# Patient Record
Sex: Female | Born: 2000 | Race: Black or African American | Hispanic: No | Marital: Single | Smoking: Never smoker
Health system: Southern US, Community
[De-identification: ages and names within clinical notes are randomized; demographics above are authoritative.]

## PROBLEM LIST (undated history)

## (undated) ENCOUNTER — Inpatient Hospital Stay (HOSPITAL_COMMUNITY): Payer: Self-pay

## (undated) DIAGNOSIS — T783XXA Angioneurotic edema, initial encounter: Secondary | ICD-10-CM

## (undated) DIAGNOSIS — O24419 Gestational diabetes mellitus in pregnancy, unspecified control: Secondary | ICD-10-CM

## (undated) DIAGNOSIS — L309 Dermatitis, unspecified: Secondary | ICD-10-CM

## (undated) DIAGNOSIS — R220 Localized swelling, mass and lump, head: Secondary | ICD-10-CM

## (undated) DIAGNOSIS — J45909 Unspecified asthma, uncomplicated: Secondary | ICD-10-CM

## (undated) DIAGNOSIS — F32A Depression, unspecified: Secondary | ICD-10-CM

## (undated) HISTORY — PX: WISDOM TOOTH EXTRACTION: SHX21

## (undated) HISTORY — DX: Angioneurotic edema, initial encounter: T78.3XXA

---
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
## 2020-01-05 ENCOUNTER — Encounter (HOSPITAL_COMMUNITY): Payer: Self-pay | Admitting: Emergency Medicine

## 2020-01-05 ENCOUNTER — Other Ambulatory Visit: Payer: Self-pay

## 2020-01-05 ENCOUNTER — Emergency Department (HOSPITAL_COMMUNITY)
Admission: EM | Admit: 2020-01-05 | Discharge: 2020-01-06 | Disposition: A | Discharge status: Home / Self Care | Payer: Medicaid Other | Attending: Emergency Medicine | Admitting: Emergency Medicine

## 2020-01-05 DIAGNOSIS — Z20822 Contact with and (suspected) exposure to covid-19: Secondary | ICD-10-CM | POA: Insufficient documentation

## 2020-01-05 DIAGNOSIS — M7918 Myalgia, other site: Secondary | ICD-10-CM | POA: Insufficient documentation

## 2020-01-05 DIAGNOSIS — Z5321 Procedure and treatment not carried out due to patient leaving prior to being seen by health care provider: Secondary | ICD-10-CM | POA: Insufficient documentation

## 2020-01-05 DIAGNOSIS — R110 Nausea: Secondary | ICD-10-CM | POA: Diagnosis not present

## 2020-01-05 LAB — RESP PANEL BY RT PCR (RSV, FLU A&B, COVID)
Influenza A by PCR: NEGATIVE
Influenza B by PCR: NEGATIVE
Respiratory Syncytial Virus by PCR: NEGATIVE
SARS Coronavirus 2 by RT PCR: NEGATIVE

## 2020-01-05 LAB — CBG MONITORING, ED: Glucose-Capillary: 89 mg/dL (ref 70–99)

## 2020-01-05 NOTE — ED Notes (Signed)
Pt left ED and stated she did not want to wait any longer.

## 2020-01-05 NOTE — ED Triage Notes (Signed)
Pt c/o body aches, nausea, unkn fever, dry cough,  HA onset yesterday. No known sick contacts

## 2020-02-20 ENCOUNTER — Other Ambulatory Visit (HOSPITAL_COMMUNITY)
Admission: RE | Admit: 2020-02-20 | Discharge: 2020-02-20 | Disposition: A | Discharge status: Home / Self Care | Payer: Medicaid Other | Source: Ambulatory Visit | Attending: Obstetrics | Admitting: Obstetrics

## 2020-02-20 ENCOUNTER — Other Ambulatory Visit: Payer: Self-pay

## 2020-02-20 ENCOUNTER — Encounter: Payer: Self-pay | Admitting: Obstetrics

## 2020-02-20 ENCOUNTER — Ambulatory Visit (INDEPENDENT_AMBULATORY_CARE_PROVIDER_SITE_OTHER): Payer: Medicaid Other | Admitting: Obstetrics

## 2020-02-20 VITALS — BP 113/76 | HR 57 | Wt 142.6 lb

## 2020-02-20 DIAGNOSIS — N898 Other specified noninflammatory disorders of vagina: Secondary | ICD-10-CM | POA: Insufficient documentation

## 2020-02-20 DIAGNOSIS — Z113 Encounter for screening for infections with a predominantly sexual mode of transmission: Secondary | ICD-10-CM

## 2020-02-20 NOTE — Progress Notes (Signed)
Subjective:    Jeanette Hall is a 19 y.o. female who presents for IUD check. The patient has no complaints today. The patient is sexually active. Pertinent past medical history: none.  The information documented in the HPI was reviewed and verified.  Menstrual History: OB History    Gravida  1   Para  1   Term  1   Preterm      AB      Living  1     SAB      TAB      Ectopic      Multiple  0   Live Births  1            Patient's last menstrual period was 01/31/2020.   Patient Active Problem List   Diagnosis Date Noted  . Encounter for insertion of copper IUD 09/01/2018  . Encounter for removal and reinsertion of IUD 09/01/2018  . IUD strings lost 02/24/2018  . IUD (intrauterine device) in place 01/26/2018  . Vitamin D deficiency 08/27/2017  . Hx of tonsillectomy 08/19/2017   Past Medical History:  Diagnosis Date  . Angio-edema   . Asthma   . Eczema   . 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    Past Surgical History:  Procedure Laterality Date  . MASS EXCISION N/A 05/05/2016   Procedure: EXCISION  OF TONGUE MASS;  Surgeon: Leta Baptist, MD;  Location: [facility];  Service: ENT;  Laterality: N/A;  EXCISION  OF TONGUE MASS     Current Outpatient Medications:  .  albuterol (VENTOLIN HFA) 108 (90 Base) MCG/ACT inhaler, Inhale 2 puffs into the lungs every 6 (six) hours as needed for wheezing or shortness of breath., Disp: 18 g, Rfl: 0 .  Levonorgestrel (LILETTA, 52 MG, IU), by Intrauterine route. Inserted 09/01/2018, Disp: , Rfl:  .  montelukast (SINGULAIR) 5 MG chewable tablet, Chew 2 tablets (10 mg total) by mouth at bedtime., Disp: 60 tablet, Rfl: 12 Allergies  Allergen Reactions  . Fish Allergy Swelling    LIPS AND EYES    Social History   Tobacco Use  . Smoking status: Never Smoker  . Smokeless tobacco: Never Used  . Tobacco comment: stepfather smokes outside  Substance Use Topics  . Alcohol use: No    Family History  Problem Relation Age of Onset   . Asthma Maternal Uncle   . Hypertension Maternal Grandmother   . Asthma Maternal Grandmother   . Cancer Maternal Grandmother   . Diabetes Maternal Grandmother   . Cancer Paternal Grandmother   . Hypertension Mother        Review of Systems Constitutional: negative for weight loss Genitourinary:negative for abnormal menstrual periods and positive for vaginal discharge   Objective:   BP 113/76   Pulse (!) 57   Wt 142 lb 9.6 oz (64.7 kg)   LMP 01/31/2020   BMI 24.48 kg/m    General:   alert and no distress  Skin:   no rash or abnormalities  Lungs:   clear to auscultation bilaterally  Heart:   regular rate and rhythm, S1, S2 normal, no murmur, click, rub or gallop  Breasts:   normal without suspicious masses, skin or nipple changes or axillary nodes  Abdomen:  normal findings: no organomegaly, soft, non-tender and no hernia  Pelvis:  External genitalia: normal general appearance Urinary system: urethral meatus normal and bladder without fullness, nontender Vaginal: normal without tenderness, induration or masses Cervix: normal appearance.  IUD string visible Adnexa: normal  bimanual exam Uterus: anteverted and non-tender, normal size   Lab Review Urine pregnancy test Labs reviewed yes Radiologic studies reviewed yes  50% of 20 min visit spent on counseling and coordination of care.    Assessment:    19 y.o., continuing IUD, no contraindications.   Plan:   1. Vaginal discharge Rx: - Cervicovaginal ancillary only( [facility])  2. Screening for STD (sexually transmitted disease) Rx: - HIV Antibody (routine testing w rflx) - Hepatitis B surface antigen - RPR - Hepatitis C antibody    All questions answered. Chlamydia specimen. Contraception: IUD. Discussed healthy lifestyle modifications. Follow up as needed. GC specimen. Wet prep.    Orders Placed This Encounter  Procedures  . HIV Antibody (routine testing w rflx)  . Hepatitis B surface antigen   . RPR  . Hepatitis C antibody    Shelly Bombard, MD 02/20/2020 9:53 AM

## 2020-02-20 NOTE — Progress Notes (Signed)
Patient presents for IUD check and complete STD testing. She states that she recently had an STD [facility]. Patient request to have everything rechecked.

## 2020-02-21 LAB — CERVICOVAGINAL ANCILLARY ONLY
Bacterial Vaginitis (gardnerella): POSITIVE — AB
Candida Glabrata: NEGATIVE
Candida Vaginitis: NEGATIVE
Chlamydia: NEGATIVE
Comment: NEGATIVE
Comment: NEGATIVE
Comment: NEGATIVE
Comment: NEGATIVE
Comment: NEGATIVE
Comment: NORMAL
Neisseria Gonorrhea: POSITIVE — AB
Trichomonas: NEGATIVE

## 2020-02-21 LAB — RPR: RPR Ser Ql: NONREACTIVE

## 2020-02-21 LAB — HEPATITIS C ANTIBODY: Hep C Virus Ab: <0.1 s/co ratio (ref 0.0–0.9)

## 2020-02-21 LAB — HEPATITIS B SURFACE ANTIGEN: Hepatitis B Surface Ag: NEGATIVE

## 2020-02-21 LAB — HIV ANTIBODY (ROUTINE TESTING W REFLEX): HIV Screen 4th Generation wRfx: NONREACTIVE

## 2020-02-22 ENCOUNTER — Other Ambulatory Visit: Payer: Self-pay | Admitting: Obstetrics

## 2020-02-22 ENCOUNTER — Other Ambulatory Visit: Payer: Self-pay

## 2020-02-22 ENCOUNTER — Telehealth: Payer: Self-pay

## 2020-02-22 DIAGNOSIS — B9689 Other specified bacterial agents as the cause of diseases classified elsewhere: Secondary | ICD-10-CM

## 2020-02-22 DIAGNOSIS — N760 Acute vaginitis: Secondary | ICD-10-CM

## 2020-02-22 DIAGNOSIS — A549 Gonococcal infection, unspecified: Secondary | ICD-10-CM

## 2020-02-22 MED ORDER — CEFTRIAXONE SODIUM 500 MG IJ SOLR: 500.0000 mg | Freq: Once | INTRAMUSCULAR | Status: DC

## 2020-02-22 MED ORDER — TINIDAZOLE 500 MG PO TABS: 1000.0000 mg | ORAL_TABLET | Freq: Every day | ORAL | 2 refills | Status: DC

## 2020-02-22 MED ORDER — METRONIDAZOLE 500 MG PO TABS: 500.0000 mg | ORAL_TABLET | Freq: Two times a day (BID) | ORAL | 0 refills | Status: DC

## 2020-02-22 MED FILL — METRONIDAZOLE 500 MG TABS: 500 | 7 days supply | Qty: 14 | Fill #0

## 2020-02-22 NOTE — Progress Notes (Signed)
Original Rx sent is not covered by pt Ins. Flagyl sent instead. Pt will be notified.

## 2020-02-22 NOTE — Telephone Encounter (Signed)
S/w pt and advised of results and rx sent. Transferred to scheduler for injection appt.

## 2020-02-23 ENCOUNTER — Other Ambulatory Visit: Payer: Self-pay

## 2020-02-23 ENCOUNTER — Ambulatory Visit (INDEPENDENT_AMBULATORY_CARE_PROVIDER_SITE_OTHER): Payer: Medicaid Other

## 2020-02-23 VITALS — Wt 143.0 lb

## 2020-02-23 DIAGNOSIS — A549 Gonococcal infection, unspecified: Secondary | ICD-10-CM

## 2020-02-23 MED ORDER — CEFTRIAXONE SODIUM 500 MG IJ SOLR
500.0000 mg | Freq: Once | INTRAMUSCULAR | Status: AC
  Administered 2020-02-23: 500 mg via INTRAMUSCULAR

## 2020-02-23 NOTE — Progress Notes (Signed)
Patient presents for Rocephin Injection today. Due to +GC. Injection given w/o any problems RUOQ Pt made aware to make TOC appt

## 2020-02-23 NOTE — Progress Notes (Signed)
I have reviewed this chart and agree with the RN/CMA assessment and management.    K. Meryl , M.D. [facility]ending [facility] (Faculty Practice)   

## 2020-03-22 ENCOUNTER — Ambulatory Visit (INDEPENDENT_AMBULATORY_CARE_PROVIDER_SITE_OTHER): Payer: Medicaid Other

## 2020-03-22 ENCOUNTER — Other Ambulatory Visit: Payer: Self-pay

## 2020-03-22 ENCOUNTER — Other Ambulatory Visit (HOSPITAL_COMMUNITY)
Admission: RE | Admit: 2020-03-22 | Discharge: 2020-03-22 | Disposition: A | Discharge status: Home / Self Care | Payer: Medicaid Other | Source: Ambulatory Visit | Attending: Obstetrics and Gynecology | Admitting: Obstetrics and Gynecology

## 2020-03-22 VITALS — BP 134/84 | HR 62 | Wt 145.0 lb

## 2020-03-22 DIAGNOSIS — A549 Gonococcal infection, unspecified: Secondary | ICD-10-CM | POA: Insufficient documentation

## 2020-03-22 DIAGNOSIS — N760 Acute vaginitis: Secondary | ICD-10-CM

## 2020-03-22 DIAGNOSIS — B9689 Other specified bacterial agents as the cause of diseases classified elsewhere: Secondary | ICD-10-CM | POA: Diagnosis present

## 2020-03-22 NOTE — Progress Notes (Signed)
Patient was assessed and managed by nursing staff during this encounter. I have reviewed the chart and agree with the documentation and plan. I have also made any necessary editorial changes.  Griffin Basil, MD 03/22/2020 6:27 PM

## 2020-03-22 NOTE — Progress Notes (Signed)
SUBJECTIVE GYN presents for self swab for TOC due to +GC , +BV. Rocephin Injection was given 02/23/2020   PLAN Self swab GC, BV probe done and sent to Lab.

## 2020-03-29 LAB — CERVICOVAGINAL ANCILLARY ONLY
Bacterial Vaginitis (gardnerella): POSITIVE — AB
Chlamydia: NEGATIVE
Comment: NEGATIVE
Comment: NEGATIVE

## 2020-04-01 ENCOUNTER — Other Ambulatory Visit: Payer: Self-pay

## 2020-04-01 ENCOUNTER — Telehealth: Payer: Self-pay

## 2020-04-01 ENCOUNTER — Other Ambulatory Visit: Payer: Self-pay | Admitting: Obstetrics and Gynecology

## 2020-04-01 MED ORDER — METRONIDAZOLE 500 MG PO TABS: 500.0000 mg | ORAL_TABLET | Freq: Two times a day (BID) | ORAL | 0 refills | Status: DC

## 2020-04-01 MED FILL — METRONIDAZOLE 500 MG TABS: 500 | 7 days supply | Qty: 14 | Fill #0

## 2020-04-01 NOTE — Telephone Encounter (Signed)
-----   Message from Griffin Basil, MD sent at 04/01/2020  8:39 AM EDT ----- BV noted on swab, offer flagyl for treatment

## 2020-04-01 NOTE — Telephone Encounter (Signed)
Patient has been informed of test results for BV. She will go ahead and pick up her prescription for BV

## 2020-04-23 MED FILL — METRONIDAZOLE 500 MG TABS: 500 | 7 days supply | Qty: 14 | Fill #0

## 2020-04-25 IMAGING — US US PELVIS COMPLETE WITH TRANSVAGINAL
1 series · 15 of 25 positions shown · non-contrast
Comparison: None

CLINICAL DATA: IUD, missing threads

EXAM:
TRANSABDOMINAL AND TRANSVAGINAL ULTRASOUND OF PELVIS
TECHNIQUE: Both transabdominal and transvaginal ultrasound examinations of the
pelvis were performed. Transabdominal technique was performed for
global imaging of the pelvis including uterus, ovaries, adnexal
regions, and pelvic cul-de-sac. It was necessary to proceed with
endovaginal exam following the transabdominal exam to visualize the
IUD and endometrium as well as the ovaries.

[Series 1: us pelvis complete with transvaginal · 15 of 81 slices shown]
[im 1/81]
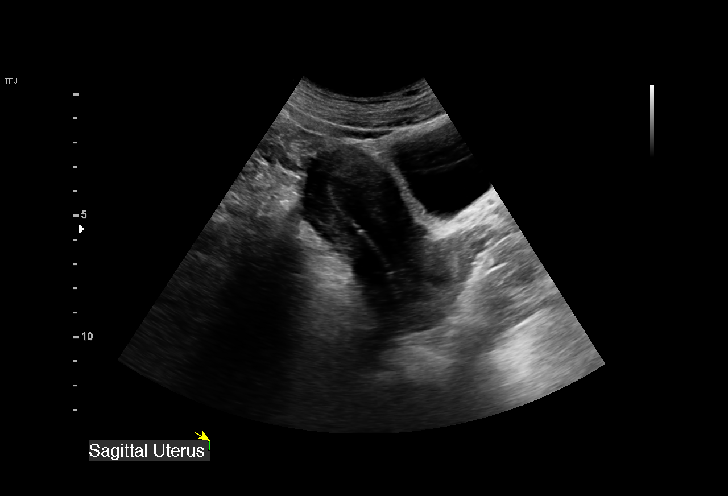
[im 7/81]
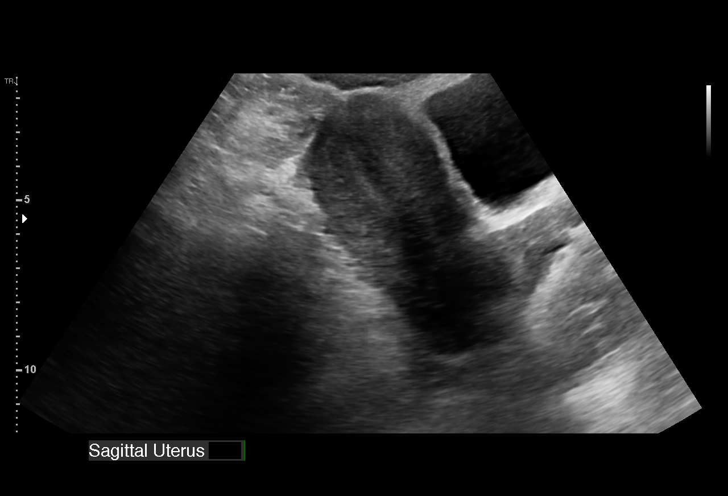
[im 14/81]
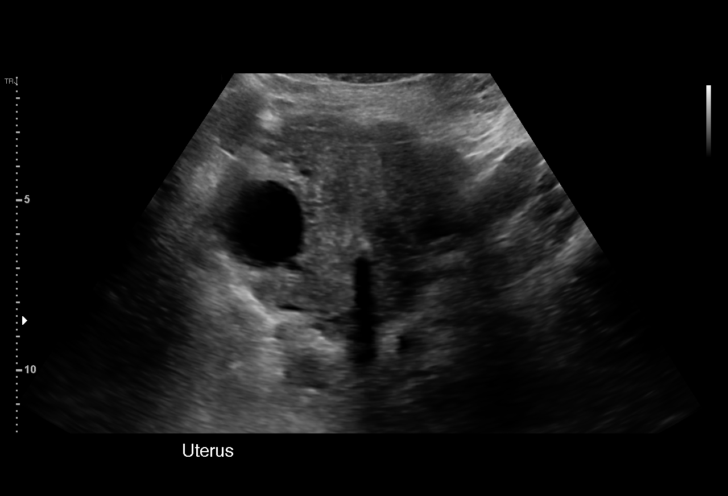
[im 17/81]
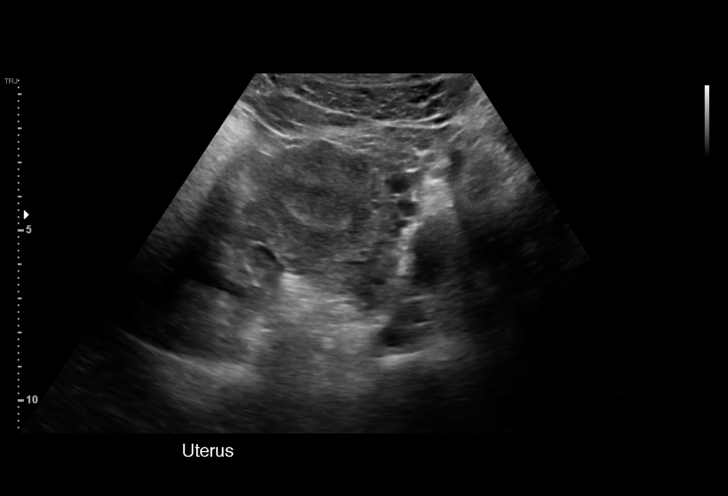
[im 24/81]
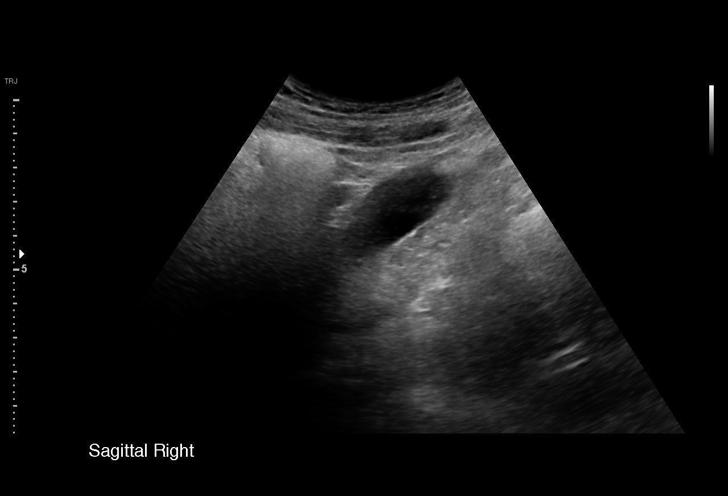
[im 31/81]
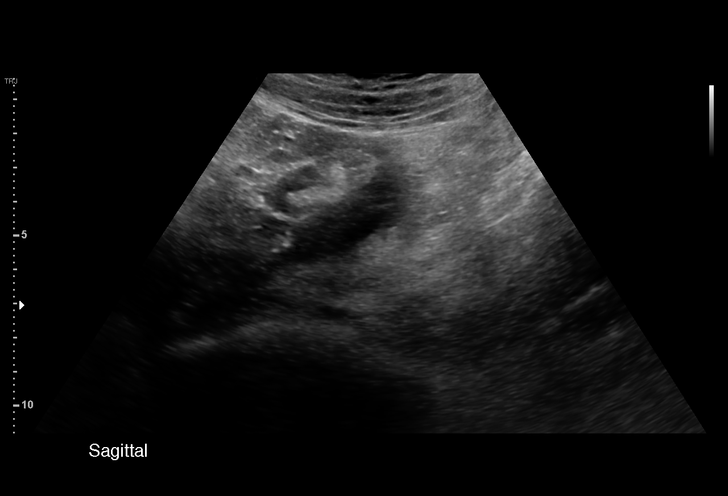
[im 34/81]
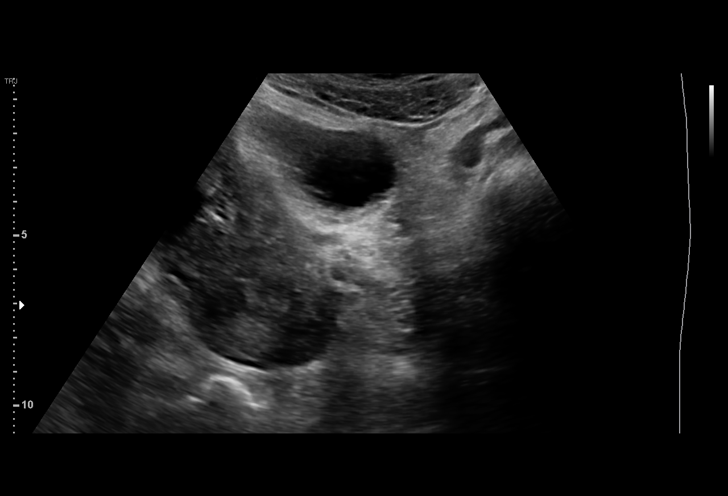
[im 41/81]
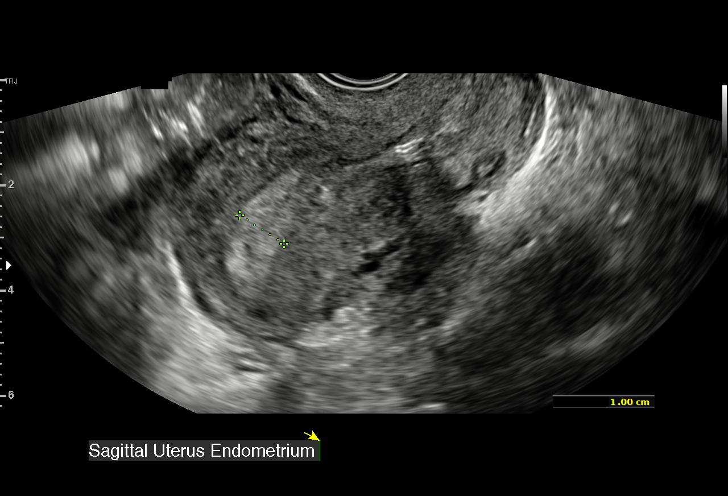
[im 47/81]
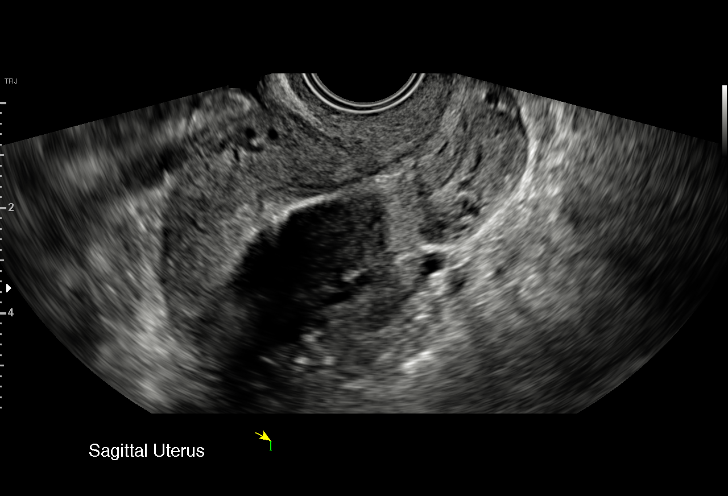
[im 51/81]
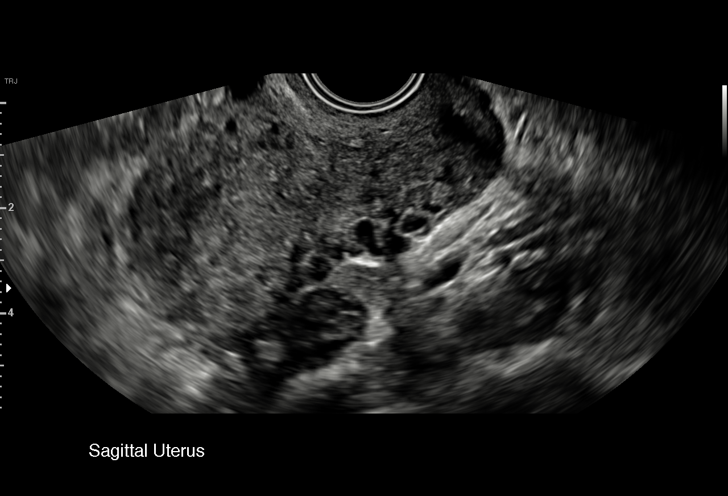
[im 57/81]
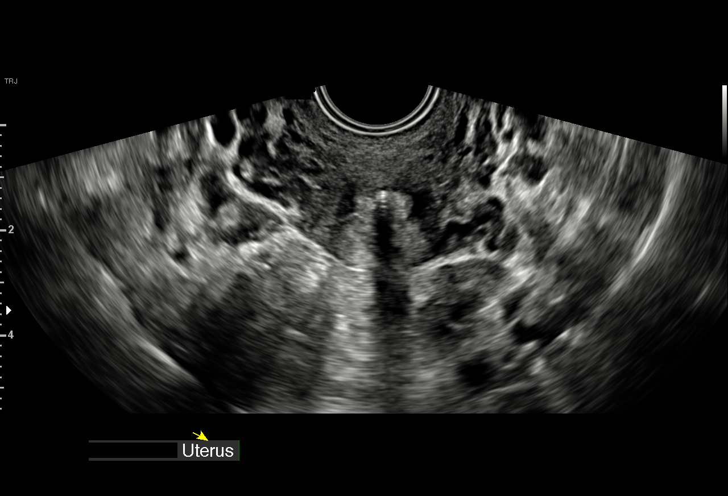
[im 64/81]
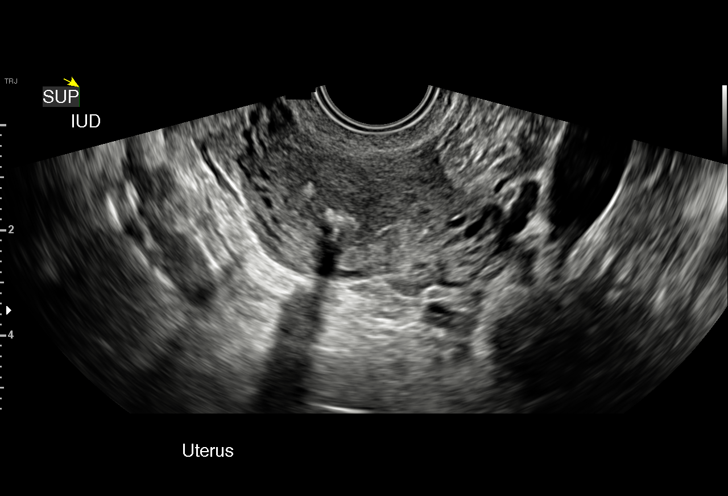
[im 67/81]
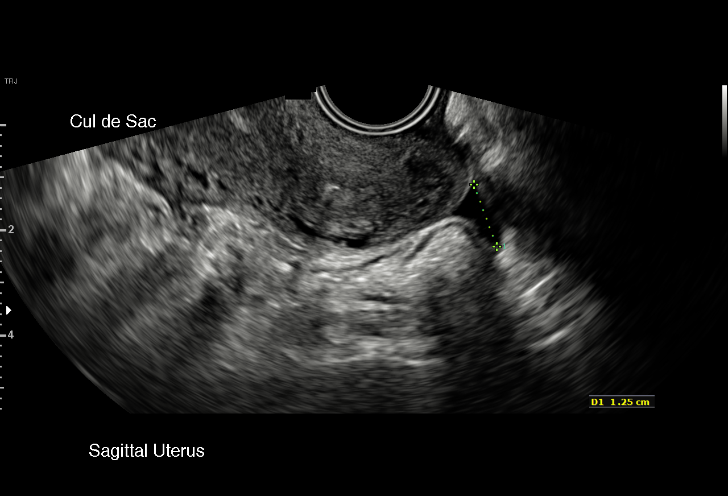
[im 74/81]
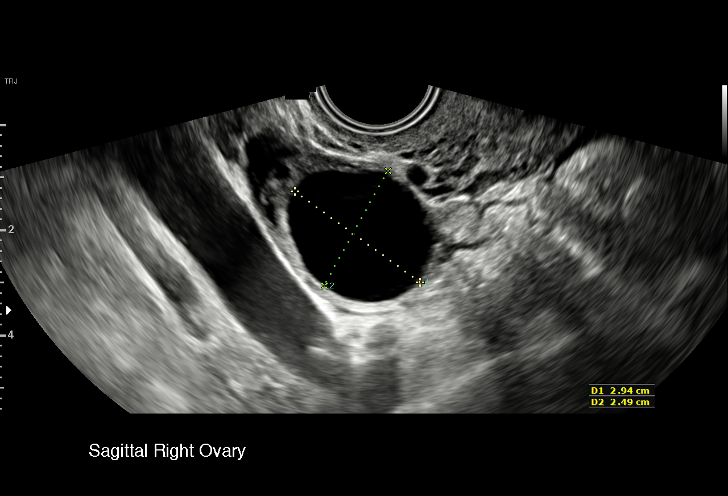
[im 81/81]
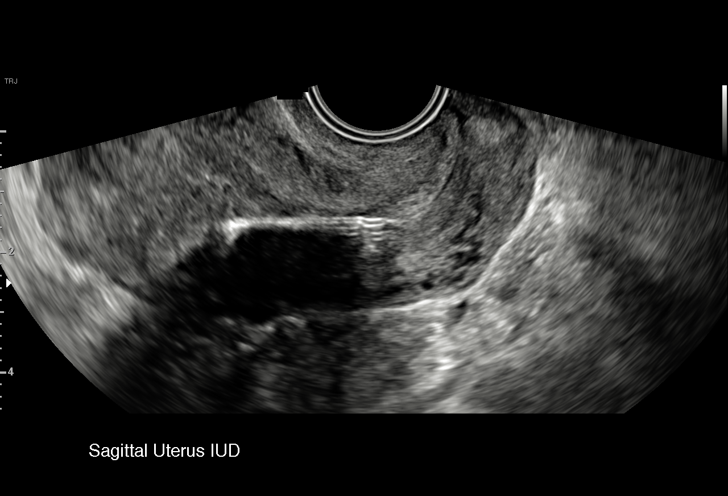

[15 of 25 positions shown; findings below may reference images not displayed]

FINDINGS: Uterus

Measurements: 8.0 x 3.8 x 4.5 cm = volume: 72.2 mL. Normal
morphology without mass

Endometrium

Thickness: 10 mm. Abnormal heterogeneous appearance with small
amount of endometrial fluid at upper uterine segment. IUD is
identified at the mid uterus. One of the limbs is demonstrated but
the second limb is not adequately localized.

Right ovary

Measurements: 3.7 x 2.9 x 3.0 cm = volume: 17.0 mL. Dominant
follicle 2.9 cm diameter without mass

Left ovary

Measurements: 2.8 x 1.4 x 1.6 cm = volume: 3.4 mL. Normal morphology
without mass

Other findings

Small amount of free fluid in RIGHT adnexa, adjacent to uterus and
in cul-de-sac. No adnexal masses..
IMPRESSION: Small amount of nonspecific endometrial fluid at upper uterine
segment.

IUD is low in position at the mid uterine segment, with 1 of the 2
arms of the IUD not demonstrated.

## 2020-06-12 ENCOUNTER — Ambulatory Visit: Payer: Medicaid Other

## 2020-06-12 ENCOUNTER — Other Ambulatory Visit: Payer: Self-pay

## 2020-06-12 ENCOUNTER — Other Ambulatory Visit (HOSPITAL_COMMUNITY)
Admission: RE | Admit: 2020-06-12 | Discharge: 2020-06-12 | Disposition: A | Discharge status: Home / Self Care | Payer: Medicaid Other | Source: Ambulatory Visit | Attending: Obstetrics and Gynecology | Admitting: Obstetrics and Gynecology

## 2020-06-12 DIAGNOSIS — N898 Other specified noninflammatory disorders of vagina: Secondary | ICD-10-CM | POA: Diagnosis present

## 2020-06-12 DIAGNOSIS — Z113 Encounter for screening for infections with a predominantly sexual mode of transmission: Secondary | ICD-10-CM

## 2020-06-12 NOTE — Progress Notes (Signed)
SUBJECTIVE:  19 y.o. female complains of white, thick vaginal discharge with an odor for 3 day(s). Denies abnormal vaginal bleeding or significant pelvic pain or fever. No UTI symptoms. Denies history of known exposure to STD.  No LMP recorded. (Menstrual status: IUD).  OBJECTIVE:  She appears well, afebrile. Urine dipstick: not done.  ASSESSMENT:  Vaginal Discharge  Vaginal Odor   PLAN:  GC, chlamydia, trichomonas, BVAG, CVAG probe sent to lab. STD blood work ordered as well per patient request. Treatment: To be determined once lab results are received ROV prn if symptoms persist or worsen.

## 2020-06-13 LAB — CERVICOVAGINAL ANCILLARY ONLY
Bacterial Vaginitis (gardnerella): POSITIVE — AB
Candida Glabrata: NEGATIVE
Candida Vaginitis: NEGATIVE
Chlamydia: NEGATIVE
Comment: NEGATIVE
Comment: NEGATIVE
Comment: NEGATIVE
Comment: NEGATIVE
Comment: NEGATIVE
Comment: NORMAL
Neisseria Gonorrhea: NEGATIVE
Trichomonas: NEGATIVE

## 2020-06-13 LAB — HEPATITIS B SURFACE ANTIGEN: Hepatitis B Surface Ag: NEGATIVE

## 2020-06-13 LAB — HEPATITIS C ANTIBODY: Hep C Virus Ab: 0.2 s/co ratio (ref 0.0–0.9)

## 2020-06-13 LAB — HIV ANTIBODY (ROUTINE TESTING W REFLEX): HIV Screen 4th Generation wRfx: NONREACTIVE

## 2020-06-13 LAB — RPR: RPR Ser Ql: NONREACTIVE

## 2020-06-14 ENCOUNTER — Other Ambulatory Visit: Payer: Self-pay

## 2020-06-14 NOTE — Telephone Encounter (Signed)
Patient has been advised of test results, medication called into her local pharmacy.

## 2020-06-19 ENCOUNTER — Other Ambulatory Visit: Payer: Self-pay | Admitting: *Deleted

## 2020-06-19 MED ORDER — METRONIDAZOLE 500 MG PO TABS
500.0000 mg | ORAL_TABLET | Freq: Two times a day (BID) | ORAL | 0 refills | Status: DC
Start: 2020-06-19 — End: 2021-03-28

## 2020-06-19 MED FILL — METRONIDAZOLE 500 MG TABS: 500 | 7 days supply | Qty: 14 | Fill #0

## 2020-06-19 NOTE — Progress Notes (Signed)
Flagyl sent for +BV °See lab result °

## 2021-03-13 ENCOUNTER — Other Ambulatory Visit (HOSPITAL_COMMUNITY)
Admission: RE | Admit: 2021-03-13 | Discharge: 2021-03-13 | Disposition: A | Discharge status: Home / Self Care | Payer: Medicaid Other | Source: Ambulatory Visit | Attending: Obstetrics and Gynecology | Admitting: Obstetrics and Gynecology

## 2021-03-13 ENCOUNTER — Ambulatory Visit (INDEPENDENT_AMBULATORY_CARE_PROVIDER_SITE_OTHER): Payer: Medicaid Other

## 2021-03-13 ENCOUNTER — Other Ambulatory Visit: Payer: Self-pay

## 2021-03-13 VITALS — BP 117/78 | HR 82

## 2021-03-13 DIAGNOSIS — N898 Other specified noninflammatory disorders of vagina: Secondary | ICD-10-CM

## 2021-03-13 DIAGNOSIS — R300 Dysuria: Secondary | ICD-10-CM | POA: Diagnosis not present

## 2021-03-13 LAB — POCT URINE QUALITATIVE DIPSTICK BLOOD: Blood, UA: NEGATIVE

## 2021-03-13 NOTE — Progress Notes (Signed)
SUBJECTIVE:  20 y.o. female complains of white vaginal discharge for several days.  Denies abnormal vaginal bleeding or significant pelvic pain or fever. No UTI symptoms. Denies history of known exposure to STD.  No LMP recorded. (Menstrual status: IUD).  OBJECTIVE:  She appears well, afebrile. Urine dipstick: negative for all components.  ASSESSMENT:  Vaginal Discharge: small  Vaginal Odor: small   PLAN:  GC, chlamydia, trichomonas, BVAG, CVAG probe sent to lab. Treatment: To be determined once lab results are received ROV prn if symptoms persist or worsen.

## 2021-03-18 LAB — CERVICOVAGINAL ANCILLARY ONLY
Bacterial Vaginitis (gardnerella): POSITIVE — AB
Candida Glabrata: NEGATIVE
Candida Vaginitis: NEGATIVE
Chlamydia: NEGATIVE
Comment: NEGATIVE
Comment: NEGATIVE
Comment: NEGATIVE
Comment: NEGATIVE
Comment: NEGATIVE
Comment: NORMAL
Neisseria Gonorrhea: NEGATIVE
Trichomonas: NEGATIVE

## 2021-03-19 ENCOUNTER — Other Ambulatory Visit (HOSPITAL_COMMUNITY): Payer: Self-pay

## 2021-03-19 MED ORDER — METRONIDAZOLE 500 MG PO TABS
500.0000 mg | ORAL_TABLET | Freq: Two times a day (BID) | ORAL | 0 refills | Status: DC
  Filled 2021-03-19: qty 14, 7d supply, fill #0

## 2021-03-19 NOTE — Addendum Note (Signed)
Addended by: Mora Bellman on: 03/19/2021 08:29 AM   Modules accepted: Orders

## 2021-03-27 ENCOUNTER — Other Ambulatory Visit (HOSPITAL_COMMUNITY): Payer: Self-pay

## 2021-03-28 ENCOUNTER — Encounter (HOSPITAL_COMMUNITY): Payer: Self-pay | Admitting: Emergency Medicine

## 2021-03-28 ENCOUNTER — Inpatient Hospital Stay (HOSPITAL_COMMUNITY)
Admission: EM | Admit: 2021-03-28 | Discharge: 2021-03-31 | DRG: 918 | Disposition: A | Discharge status: Other Acute Inpatient Hospital | Payer: Medicaid Other | Attending: Internal Medicine | Admitting: Internal Medicine

## 2021-03-28 DIAGNOSIS — T50904A Poisoning by unspecified drugs, medicaments and biological substances, undetermined, initial encounter: Secondary | ICD-10-CM | POA: Diagnosis not present

## 2021-03-28 DIAGNOSIS — Z20822 Contact with and (suspected) exposure to covid-19: Secondary | ICD-10-CM | POA: Diagnosis not present

## 2021-03-28 DIAGNOSIS — Z8249 Family history of ischemic heart disease and other diseases of the circulatory system: Secondary | ICD-10-CM | POA: Diagnosis not present

## 2021-03-28 DIAGNOSIS — Z5900 Homelessness unspecified: Secondary | ICD-10-CM | POA: Diagnosis not present

## 2021-03-28 DIAGNOSIS — T50902A Poisoning by unspecified drugs, medicaments and biological substances, intentional self-harm, initial encounter: Secondary | ICD-10-CM | POA: Diagnosis not present

## 2021-03-28 DIAGNOSIS — Z833 Family history of diabetes mellitus: Secondary | ICD-10-CM | POA: Diagnosis not present

## 2021-03-28 DIAGNOSIS — Z79899 Other long term (current) drug therapy: Secondary | ICD-10-CM

## 2021-03-28 DIAGNOSIS — J45909 Unspecified asthma, uncomplicated: Secondary | ICD-10-CM | POA: Diagnosis not present

## 2021-03-28 DIAGNOSIS — F322 Major depressive disorder, single episode, severe without psychotic features: Secondary | ICD-10-CM | POA: Diagnosis present

## 2021-03-28 DIAGNOSIS — Z9151 Personal history of suicidal behavior: Secondary | ICD-10-CM

## 2021-03-28 DIAGNOSIS — I959 Hypotension, unspecified: Secondary | ICD-10-CM | POA: Diagnosis not present

## 2021-03-28 DIAGNOSIS — T484X2A Poisoning by expectorants, intentional self-harm, initial encounter: Secondary | ICD-10-CM | POA: Diagnosis not present

## 2021-03-28 DIAGNOSIS — T483X2A Poisoning by antitussives, intentional self-harm, initial encounter: Secondary | ICD-10-CM | POA: Diagnosis not present

## 2021-03-28 DIAGNOSIS — E876 Hypokalemia: Secondary | ICD-10-CM | POA: Diagnosis present

## 2021-03-28 DIAGNOSIS — L309 Dermatitis, unspecified: Secondary | ICD-10-CM

## 2021-03-28 DIAGNOSIS — T443X2A Poisoning by other parasympatholytics [anticholinergics and antimuscarinics] and spasmolytics, intentional self-harm, initial encounter: Principal | ICD-10-CM | POA: Diagnosis present

## 2021-03-28 DIAGNOSIS — R4182 Altered mental status, unspecified: Secondary | ICD-10-CM | POA: Diagnosis not present

## 2021-03-28 DIAGNOSIS — R45851 Suicidal ideations: Secondary | ICD-10-CM | POA: Diagnosis not present

## 2021-03-28 DIAGNOSIS — T444X2A Poisoning by predominantly alpha-adrenoreceptor agonists, intentional self-harm, initial encounter: Secondary | ICD-10-CM | POA: Diagnosis present

## 2021-03-28 DIAGNOSIS — T887XXA Unspecified adverse effect of drug or medicament, initial encounter: Secondary | ICD-10-CM | POA: Diagnosis not present

## 2021-03-28 DIAGNOSIS — R9431 Abnormal electrocardiogram [ECG] [EKG]: Secondary | ICD-10-CM | POA: Diagnosis not present

## 2021-03-28 DIAGNOSIS — T1491XA Suicide attempt, initial encounter: Secondary | ICD-10-CM | POA: Diagnosis present

## 2021-03-28 DIAGNOSIS — R739 Hyperglycemia, unspecified: Secondary | ICD-10-CM | POA: Diagnosis not present

## 2021-03-28 HISTORY — DX: Personal history of suicidal behavior: Z91.51

## 2021-03-28 HISTORY — DX: Unspecified asthma, uncomplicated: J45.909

## 2021-03-28 HISTORY — DX: Dermatitis, unspecified: L30.9

## 2021-03-28 LAB — COMPREHENSIVE METABOLIC PANEL
ALT: 26 U/L (ref 0–44)
AST: 33 U/L (ref 15–41)
Albumin: 3.8 g/dL (ref 3.5–5.0)
Alkaline Phosphatase: 79 U/L (ref 38–126)
Anion gap: 8 (ref 5–15)
BUN: 10 mg/dL (ref 6–20)
CO2: 25 mmol/L (ref 22–32)
Calcium: 9.1 mg/dL (ref 8.9–10.3)
Chloride: 112 mmol/L — ABNORMAL HIGH (ref 98–111)
Creatinine, Ser: 0.83 mg/dL (ref 0.44–1.00)
GFR, Estimated: >60 mL/min (ref >60)
Glucose, Bld: 137 mg/dL — ABNORMAL HIGH (ref 70–99)
Potassium: 3.7 mmol/L (ref 3.5–5.1)
Sodium: 145 mmol/L (ref 135–145)
Total Bilirubin: 0.6 mg/dL (ref 0.3–1.2)
Total Protein: 7 g/dL (ref 6.5–8.1)

## 2021-03-28 LAB — CBC WITH DIFFERENTIAL/PLATELET
Abs Immature Granulocytes: 0.06 10*3/uL (ref 0.00–0.07)
Basophils Absolute: 0 10*3/uL (ref 0.0–0.1)
Basophils Relative: 0 %
Eosinophils Absolute: 0.2 10*3/uL (ref 0.0–0.5)
Eosinophils Relative: 1 %
HCT: 41.9 % (ref 36.0–46.0)
Hemoglobin: 13.1 g/dL (ref 12.0–15.0)
Immature Granulocytes: 0 %
Lymphocytes Relative: 7 %
Lymphs Abs: 1 10*3/uL (ref 0.7–4.0)
MCH: 28 pg (ref 26.0–34.0)
MCHC: 31.3 g/dL (ref 30.0–36.0)
MCV: 89.5 fL (ref 80.0–100.0)
Monocytes Absolute: 0.6 10*3/uL (ref 0.1–1.0)
Monocytes Relative: 4 %
Neutro Abs: 11.7 10*3/uL — ABNORMAL HIGH (ref 1.7–7.7)
Neutrophils Relative %: 88 %
Platelets: 417 10*3/uL — ABNORMAL HIGH (ref 150–400)
RBC: 4.68 MIL/uL (ref 3.87–5.11)
RDW: 14.7 % (ref 11.5–15.5)
WBC: 13.6 10*3/uL — ABNORMAL HIGH (ref 4.0–10.5)
nRBC: 0 % (ref 0.0–0.2)

## 2021-03-28 LAB — CBG MONITORING, ED: Glucose-Capillary: 128 mg/dL — ABNORMAL HIGH (ref 70–99)

## 2021-03-28 LAB — URINALYSIS, ROUTINE W REFLEX MICROSCOPIC
Bacteria, UA: NONE SEEN
Bilirubin Urine: NEGATIVE
Glucose, UA: NEGATIVE mg/dL
Hgb urine dipstick: NEGATIVE
Leukocytes,Ua: NEGATIVE
Nitrite: NEGATIVE
Specific Gravity, Urine: >1.03 — ABNORMAL HIGH (ref 1.005–1.030)
pH: 6 (ref 5.0–8.0)

## 2021-03-28 LAB — I-STAT BETA HCG BLOOD, ED (MC, WL, AP ONLY): I-stat hCG, quantitative: <5 m[IU]/mL (ref <5)

## 2021-03-28 LAB — RAPID URINE DRUG SCREEN, HOSP PERFORMED
Amphetamines: NOT DETECTED
Barbiturates: NOT DETECTED
Benzodiazepines: NOT DETECTED
Cocaine: NOT DETECTED
Opiates: POSITIVE — AB
Tetrahydrocannabinol: POSITIVE — AB

## 2021-03-28 LAB — ACETAMINOPHEN LEVEL: Acetaminophen (Tylenol), Serum: 17 ug/mL (ref 10–30)

## 2021-03-28 LAB — RESP PANEL BY RT-PCR (FLU A&B, COVID) ARPGX2
Influenza A by PCR: NEGATIVE
Influenza B by PCR: NEGATIVE
SARS Coronavirus 2 by RT PCR: NEGATIVE

## 2021-03-28 LAB — SALICYLATE LEVEL: Salicylate Lvl: <7 mg/dL — ABNORMAL LOW (ref 7.0–30.0)

## 2021-03-28 LAB — ETHANOL: Alcohol, Ethyl (B): <10 mg/dL (ref <10)

## 2021-03-28 LAB — MAGNESIUM: Magnesium: 1.8 mg/dL (ref 1.7–2.4)

## 2021-03-28 MED ORDER — TRAZODONE HCL 50 MG PO TABS
25.0000 mg | ORAL_TABLET | Freq: Every evening | ORAL | Status: DC | PRN
  Administered 2021-03-29 – 2021-03-30 (×2): 25 mg via ORAL
  Filled 2021-03-28 (×2): qty 1

## 2021-03-28 MED ORDER — ENOXAPARIN SODIUM 40 MG/0.4ML IJ SOSY
40.0000 mg | PREFILLED_SYRINGE | INTRAMUSCULAR | Status: DC
  Administered 2021-03-31: 40 mg via SUBCUTANEOUS
  Filled 2021-03-28: qty 0.4

## 2021-03-28 MED ORDER — SODIUM CHLORIDE 0.9 % IV BOLUS
1000.0000 mL | Freq: Once | INTRAVENOUS | Status: AC
  Administered 2021-03-28: 1000 mL via INTRAVENOUS

## 2021-03-28 MED ORDER — SODIUM CHLORIDE 0.9 % IV SOLN: Freq: Once | INTRAVENOUS | Status: AC

## 2021-03-28 MED ORDER — ALBUTEROL SULFATE (2.5 MG/3ML) 0.083% IN NEBU: 2.5000 mg | INHALATION_SOLUTION | Freq: Four times a day (QID) | RESPIRATORY_TRACT | Status: DC | PRN

## 2021-03-28 MED ORDER — HYDROCORTISONE 1 % EX CREA
TOPICAL_CREAM | Freq: Two times a day (BID) | CUTANEOUS | Status: DC
  Administered 2021-03-30: 1 via TOPICAL
  Filled 2021-03-28 (×3): qty 28

## 2021-03-28 MED ORDER — ALBUTEROL SULFATE HFA 108 (90 BASE) MCG/ACT IN AERS: 2.0000 | INHALATION_SPRAY | Freq: Four times a day (QID) | RESPIRATORY_TRACT | Status: DC | PRN

## 2021-03-28 MED ORDER — POLYETHYLENE GLYCOL 3350 17 G PO PACK
17.0000 g | PACK | Freq: Every day | ORAL | Status: DC | PRN
  Administered 2021-03-30 – 2021-03-31 (×2): 17 g via ORAL
  Filled 2021-03-28 (×2): qty 1

## 2021-03-28 MED ORDER — IBUPROFEN 200 MG PO TABS
400.0000 mg | ORAL_TABLET | Freq: Four times a day (QID) | ORAL | Status: DC | PRN
  Administered 2021-03-29 – 2021-03-31 (×3): 400 mg via ORAL
  Filled 2021-03-28 (×3): qty 2

## 2021-03-28 MED ORDER — NALOXONE HCL 0.4 MG/ML IJ SOLN
0.4000 mg | Freq: Once | INTRAMUSCULAR | Status: AC
  Administered 2021-03-28: 0.4 mg via INTRAVENOUS
  Filled 2021-03-28: qty 1

## 2021-03-28 MED ORDER — ONDANSETRON HCL 4 MG/2ML IJ SOLN
4.0000 mg | Freq: Once | INTRAMUSCULAR | Status: AC
Start: 2021-03-28 — End: 2021-03-28
  Administered 2021-03-28: 4 mg via INTRAVENOUS
  Filled 2021-03-28: qty 2

## 2021-03-28 MED ORDER — LACTATED RINGERS IV SOLN: INTRAVENOUS | Status: DC

## 2021-03-28 NOTE — ED Notes (Signed)
Patient attempted but was unable to give a urine sample at this time.

## 2021-03-28 NOTE — ED Triage Notes (Signed)
Pt arrives via EMS from home after an attempted overdose. Pt reportedly drank half a 118 ML bottle of Childrens mucus relief (contains acetaminophen, dextromethophan, guaifenesin, phenylephrine), 1 half bottle of a 276m bottle of Tussin cough and chest congestion (dextromethrophan and guaifenesin) and swallowed approximately 45 10 mg tablets of dicyclomine. Pt reports suicidal thoughts for "awhile".

## 2021-03-28 NOTE — H&P (Addendum)
History and Physical    Jeanette Hall [id number] DOB: 2001/06/06 DOA: 03/28/2021  PCP: Pcp, No  Patient coming from: Home  I have personally briefly reviewed patient's old medical records in [place]  Chief Complaint: Suicide attempt  HPI: Jeanette Hall is a 20 y.o. female with medical history significant of asthma and eczema who reports 3-year history of feeling exceptionally sad.  Discussed about today that she attempted an overdose.  She drank half of a 118 mL bottle of children's Mucus Relief (contains acetaminophen, dextromethorphan, guaifenesin, phenylephrine), and half a bottle of 237 mL of Tessalon cough and chest congestion (dextromethorphan and guaifenesin) and swallowed approximately 45 tablets of 10 mg of dicyclomine.  She reports taking some other medication of her mother's though she is unclear exactly the name of it but thinks it might of contained an opiate.  The patient also reports periods of what sound like hypomania.  She reports poor sleeping habits, mind racing, and feeling like she can get anything done.  This is followed by periods of dark depression and she normally self isolates.  She is a mom to 54-year-old.  She reports that she has a good support system but does not feel she is able to talk to anyone about her depression because they "blow her off". ED Course: The EDP contacted poison control.  Labs were done and did not show worrisome findings.  Her overdose occurred at approximately 215.  Patient continues to feel drowsy and somewhat off and [facility] is asked to admit for continued observation while these medications were off.  Review of Systems: As per HPI otherwise 10 point review of systems negative.   Past Medical History:  Diagnosis Date   Angio-edema    Asthma    Eczema    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    Past Surgical History:  Procedure Laterality Date   MASS EXCISION N/A 05/05/2016   Procedure: EXCISION  OF TONGUE MASS;  Surgeon: Leta Baptist, MD;  Location:  [facility];  Service: ENT;  Laterality: N/A;  EXCISION  OF TONGUE MASS     reports that she has never smoked. She has never used smokeless tobacco. She reports current drug use. Drug: Marijuana. She reports that she does not drink alcohol.  Allergies  Allergen Reactions   Fish Allergy Swelling    LIPS AND EYES    Family History  Problem Relation Age of Onset   Asthma Maternal Uncle    Hypertension Maternal Grandmother    Asthma Maternal Grandmother    Cancer Maternal Grandmother    Diabetes Maternal Grandmother    Cancer Paternal Grandmother    Hypertension Mother    Patient lives with her mother  Prior to Admission medications   Medication Sig Start Date End Date Taking? Authorizing Provider  albuterol (VENTOLIN HFA) 108 (90 Base) MCG/ACT inhaler Inhale 2 puffs into the lungs every 6 (six) hours as needed for wheezing or shortness of breath. 05/30/19   Jaynee Eagles, PA-C  Levonorgestrel (LILETTA, 52 MG, IU) by Intrauterine route. Inserted 09/01/2018 09/01/18   [provider]  metroNIDAZOLE (FLAGYL) 500 MG tablet Take 1 tablet (500 mg total) by mouth 2 (two) times daily. 06/19/20   Griffin Basil, MD  metroNIDAZOLE (FLAGYL) 500 MG tablet Take 1 tablet (500 mg total) by mouth 2 (two) times daily. (No Alcohol) 03/19/21   Constant, Peggy, MD  montelukast (SINGULAIR) 5 MG chewable tablet Chew 2 tablets (10 mg total) by mouth at bedtime. 11/11/17  Denney, Rachelle A, CNM  cetirizine (ZYRTEC) 10 MG tablet Take 1 tablet (10 mg total) by mouth daily. Patient not taking: Reported on 03/21/2019 12/31/17 05/30/19  Kennith Gain, MD  fluticasone [facility]) 50 MCG/ACT nasal spray Place 2 sprays into both nostrils daily. Patient not taking: Reported on 03/21/2019 11/11/17 05/30/19  Morene Crocker, CNM    Physical Exam: Vitals:   03/28/21 2130 03/28/21 2215 03/28/21 2224 03/28/21 2230  BP:  (!) 120/59 134/87 138/72  Pulse:  (!) 58 80 64  Resp: 18  (!) 22 14   Temp:      TempSrc:      SpO2:  97% 97% 97%  Height:        Constitutional: NAD, calm, comfortable Eyes: PERRL, lids and conjunctivae normal ENMT: Mucous membranes are moist. Posterior pharynx clear of any exudate or lesions.Normal dentition.  Neck: normal, supple, no masses, no thyromegaly Respiratory: clear to auscultation bilaterally, no wheezing, no crackles. Normal respiratory effort. No accessory muscle use.  Cardiovascular: Regular rate and rhythm, no murmurs / rubs / gallops. No extremity edema. 2+ pedal pulses. No carotid bruits.  Abdomen: no tenderness, no masses palpated. No hepatosplenomegaly. Bowel sounds positive.  Musculoskeletal: no clubbing / cyanosis. No joint deformity upper and lower extremities. Good ROM, no contractures. Normal muscle tone.  Skin: Most of the skin has hyperpigmented dry areas with some mild lichenification. Neurologic: Grossly normal Psychiatric: Normal judgment and insight. Alert and oriented x 3. Normal mood.   Labs on Admission: I have personally reviewed following labs and imaging studies  CBC: Recent Labs  Lab 03/28/21 1803  WBC 13.6*  NEUTROABS 11.7*  HGB 13.1  HCT 41.9  MCV 89.5  PLT [id number]*   Basic Metabolic Panel: Recent Labs  Lab 03/28/21 1803 03/28/21 1807  NA 145  --   K 3.7  --   CL 112*  --   CO2 25  --   GLUCOSE 137*  --   BUN 10  --   CREATININE 0.83  --   CALCIUM 9.1  --   MG  --  1.8   GFR: CrCl cannot be calculated (Unknown ideal weight.). Liver Function Tests: Recent Labs  Lab 03/28/21 1803  AST 33  ALT 26  ALKPHOS 79  BILITOT 0.6  PROT 7.0  ALBUMIN 3.8   CBG: Recent Labs  Lab 03/28/21 1809  GLUCAP 128*    Urine analysis:    Component Value Date/Time   COLORURINE YELLOW (A) 03/28/2021 2100   APPEARANCEUR CLEAR (A) 03/28/2021 2100   LABSPEC >1.030 (H) 03/28/2021 2100   PHURINE 6.0 03/28/2021 2100   GLUCOSEU NEGATIVE 03/28/2021 2100   HGBUR NEGATIVE 03/28/2021 2100   BILIRUBINUR NEGATIVE  03/28/2021 2100   BILIRUBINUR negative 02/02/2018 1126   KETONESUR TRACE (A) 03/28/2021 2100   PROTEINUR TRACE (A) 03/28/2021 2100   UROBILINOGEN 0.2 02/02/2018 1126   UROBILINOGEN 1.0 03/28/2014 2233   NITRITE NEGATIVE 03/28/2021 2100   LEUKOCYTESUR NEGATIVE 03/28/2021 2100    Radiological Exams on Admission: No results found.  EKG: Independently reviewed.  Sinus rhythm, ST depression  Assessment/Plan Principal Problem:   Intentional overdose of drug in tablet form (HCC) Active Problems:   Eczema   Asthma  Intentional overdose Continue to monitor on telemetry  IV fluid hydration Suicide precautions with sitter  Depression plus or minus bipolar disorder Psych consult in the morning Patient can be started on antidepressant if we do not think she needs a mood stabilizer but will defer to  psychiatric input.  Eczema Patient has significant eczema covering most of her body.  She does all the things she is supposed to do for eczema including using Dove soap and Eucerin moisturizer.  We will add hydrocortisone although triamcinolone would probably be better.  Asthma Reports intermittent use of her son's inhaler Consider discharge home with a new inhaler.  DVT prophylaxis: Lovenox SQ Code Status: Full code  Family Communication: Patient at bedside Disposition Plan: [place] called: None tonight should call psych in the morning Admission status: Inpatient patient is inpatient due to need for IV hydration, observation and unsafe discharge plan.   Donnamae Jude MD [facility]  If 7PM-7AM, please contact night-coverage 03/28/2021, 11:49 PM

## 2021-03-28 NOTE — ED Provider Notes (Signed)
[facility] DEPT Provider Note   CSN: [id number] Arrival date & time: 03/28/21  1647     History Chief Complaint  Patient presents with   Drug Overdose    Jeanette Hall is a 20 y.o. female.  No notable past medical history.  Pt arrives via EMS from home after an attempted overdose. Pt reportedly drank half a 118 ML bottle of Childrens mucus relief (contains acetaminophen, dextromethophan, guaifenesin, phenylephrine), 1 half bottle of a 257m bottle of Tussin cough and chest congestion (dextromethrophan and guaifenesin) and swallowed approximately 45 10 mg tablets of dicyclomine. Pt reports suicidal thoughts for "awhile".  When I talked to patient she says that she took the medications at approximately 215 this afternoon.  She states that her friends found her in the bathroom and then they called EMS.  She also notes that she did take several pills of some sort of anxiety medication but has no idea what medication it was.  She states that she feels very tired and feels very off.  Notes that she was trying to commit suicide.  She denies any chest pain, shortness of breath, abdominal pain, vision changes.   Drug Overdose Pertinent negatives include no chest pain, no abdominal pain, no headaches and no shortness of breath.      Past Medical History:  Diagnosis Date   Angio-edema    Asthma    Eczema    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    Patient Active Problem List   Diagnosis Date Noted   Int[place] drug in tablet form ([place] 03/28/2021   Eczema 03/28/2021   Asthma 03/28/2021   [place] ([place] 03/28/2021   Encounter for insertion of copper IUD 09/01/2018   Encounter for removal and reinsertion of IUD 09/01/2018   IUD strings lost 02/24/2018   IUD (intrauterine device) in place 01/26/2018   Vitamin D deficiency 08/27/2017   Hx of tonsillectomy 08/19/2017    Past Surgical History:  Procedure Laterality Date   MASS EXCISION N/A 05/05/2016    Procedure: EXCISION  OF TONGUE MASS;  Surgeon: SLeta Baptist MD;  [place]  Service: E[place];  Laterality: N/A;  EXCISION  OF TONGUE MASS     OB History     Gravida  1   Para  1   Term  1   Preterm      AB      Living  1      SAB      IAB      Ectopic      Multiple  0   Live Births  1           Family History  Problem Relation Age of Onset   Asthma Maternal Uncle    Hypertension Maternal Grandmother    Asthma Maternal Grandmother    Cancer Maternal Grandmother    Diabetes Maternal Grandmother    Cancer Paternal Grandmother    Hypertension Mother     Social History   Tobacco Use   Smoking status: Never   Smokeless tobacco: Never   Tobacco comments:    stepfather smokes outside  Vaping Use   Vaping Use: Never used  Substance Use Topics   Alcohol use: No   Drug use: Yes    Types: Marijuana    Home Medications Prior to Admission medications   Medication Sig Start Date End Date Taking? Authorizing Provider  albuterol (VENTOLIN HFA) 108 (90 Base) MCG/ACT inhaler Inhale 2 puffs into the  lungs every 6 (six) hours as needed for wheezing or shortness of breath. 05/30/19   Jaynee Eagles, PA-C  Levonorgestrel (LILETTA, 52 MG, IU) by Intrauterine route. Inserted 09/01/2018 09/01/18   [provider]  montelukast (SINGULAIR) 5 MG chewable tablet Chew 2 tablets (10 mg total) by mouth at bedtime. 11/11/17   Kandis Cocking A, CNM  cetirizine (ZYRTEC) 10 MG tablet Take 1 tablet (10 mg total) by mouth daily. Patient not taking: Reported on 03/21/2019 12/31/17 05/30/19  Kennith Gain, MD  fluticasone [facility]) 50 MCG/ACT nasal spray Place 2 sprays into both nostrils daily. Patient not taking: Reported on 03/21/2019 11/11/17 05/30/19  Morene Crocker, CNM    Allergies    Fish allergy  Review of Systems   Review of Systems  Constitutional:  Positive for fatigue. Negative for chills and fever.  HENT:  Negative for congestion  and rhinorrhea.   Eyes:  Negative for visual disturbance.  Respiratory:  Negative for cough, chest tightness and shortness of breath.   Cardiovascular:  Negative for chest pain, palpitations and leg swelling.  Gastrointestinal:  Negative for abdominal pain, constipation, diarrhea, nausea and vomiting.  Genitourinary:  Negative for difficulty urinating.  Musculoskeletal:  Negative for back pain.  Skin:  Negative for rash and wound.  Neurological:  Positive for weakness. Negative for dizziness, syncope, light-headedness and headaches.  All other systems reviewed and are negative.  Physical Exam Updated Vital Signs BP 138/72   Pulse 64   Temp 97.7 F (36.5 C) (Oral)   Resp 14   Ht '5\' 4"'$  (1.626 m)   SpO2 97%   BMI 24.89 kg/m   Physical Exam Vitals and nursing note reviewed.  Constitutional:      General: She is not in acute distress.    Appearance: Normal appearance. She is not ill-appearing, toxic-appearing or diaphoretic.  HENT:     Head: Normocephalic and atraumatic.  Eyes:     General: No scleral icterus.       Right eye: No discharge.        Left eye: No discharge.     Conjunctiva/sclera: Conjunctivae normal.  Cardiovascular:     Rate and Rhythm: Normal rate and regular rhythm.     Pulses: Normal pulses.     Heart sounds: Normal heart sounds. No murmur heard.   No friction rub. No gallop.  Pulmonary:     Effort: Pulmonary effort is normal. No respiratory distress.     Breath sounds: No wheezing, rhonchi or rales.  Abdominal:     General: Abdomen is flat. Bowel sounds are normal. There is no distension.     Palpations: Abdomen is soft.     Tenderness: There is no abdominal tenderness. There is no guarding or rebound.  Skin:    General: Skin is warm and dry.  Neurological:     Mental Status: She is oriented to person, place, and time. She is lethargic.  Psychiatric:        Attention and Perception: Attention normal.        Mood and Affect: Mood and affect normal.         Behavior: Behavior normal. Behavior is cooperative.        Thought Content: Thought content includes suicidal ideation. Thought content does not include homicidal ideation.    ED Results / Procedures / Treatments   Labs (all labs ordered are listed, but only abnormal results are displayed) Labs Reviewed  COMPREHENSIVE METABOLIC PANEL - Abnormal; Notable for the following components:  Result Value   Chloride 112 (*)    Glucose, Bld 137 (*)    All other components within normal limits  RAPID URINE DRUG SCREEN, HOSP PERFORMED - Abnormal; Notable for the following components:   Opiates POSITIVE (*)    Tetrahydrocannabinol POSITIVE (*)    All other components within normal limits  CBC WITH DIFFERENTIAL/PLATELET - Abnormal; Notable for the following components:   WBC 13.6 (*)    Platelets 417 (*)    Neutro Abs 11.7 (*)    All other components within normal limits  SALICYLATE LEVEL - Abnormal; Notable for the following components:   Salicylate Lvl [id number] (*)    All other components within normal limits  URINALYSIS, ROUTINE W REFLEX MICROSCOPIC - Abnormal; Notable for the following components:   Color, Urine YELLOW (*)    APPearance CLEAR (*)    Specific Gravity, Urine >1.030 (*)    Ketones, ur TRACE (*)    Protein, ur TRACE (*)    All other components within normal limits  CBG MONITORING, ED - Abnormal; Notable for the following components:   Glucose-Capillary 128 (*)    All other components within normal limits  RESP PANEL BY RT-PCR (FLU A&B, COVID) ARPGX2  ETHANOL  ACETAMINOPHEN LEVEL  MAGNESIUM  TSH  COMPREHENSIVE METABOLIC PANEL  CBC  I-STAT BETA HCG BLOOD, ED ([facility], [facility], [facility] ONLY)    EKG EKG Interpretation  Date/Time:  Friday March 28 2021 17:03:18 EDT Ventricular Rate:  99 PR Interval:  131 QRS Duration: 72 QT Interval:  340 QTC Calculation: 437 R Axis:   61 Text Interpretation: Sinus rhythm Biatrial enlargement Minimal ST depression, diffuse leads NSR,  normal intervals Confirmed by Lavenia Atlas [phone]) on 03/28/2021 8:55:54 PM  Radiology No results found.  Procedures Procedures   Medications Ordered in ED Medications  hydrocortisone cream 1 % ( Topical Given 03/28/21 2225)  enoxaparin (LOVENOX) injection 40 mg (has no administration in time range)  lactated ringers infusion (has no administration in time range)  ibuprofen (ADVIL) tablet 400 mg (has no administration in time range)  traZODone (DESYREL) tablet 25 mg (has no administration in time range)  polyethylene glycol (MIRALAX / GLYCOLAX) packet 17 g (has no administration in time range)  albuterol (PROVENTIL) (2.5 MG/3ML) 0.083% nebulizer solution 2.5 mg (has no administration in time range)  0.9 %  sodium chloride infusion ( Intravenous New Bag/Given 03/28/21 1813)  sodium chloride 0.9 % bolus 1,000 mL (0 mLs Intravenous Stopped 03/29/21 0002)  ondansetron (ZOFRAN) injection 4 mg (4 mg Intravenous Given 03/28/21 2049)  naloxone [facility]) injection 0.4 mg (0.4 mg Intravenous Given 03/28/21 2236)    ED Course  I have reviewed the triage vital signs and the nursing notes.  Pertinent labs & imaging results that were available during my care of the patient were reviewed by me and considered in my medical decision making (see chart for details).  Clinical Course as of 03/29/21 0011  Fri Mar 28, 2021  2324 Spoke with Hospitalist at 2324 [GL]    Clinical Course User Index [GL] Sherre Poot, Adora Fridge, PA-C   MDM Rules/Calculators/A&P                         This is a well-appearing 20 year old female who presents after a suicide attempt with multiple medications that are stated in HPI.  Patient's vitals are stable when I saw patient however she was admitted with a pulse rate of 113.  She is afebrile.  Her exam is very nontoxic.  She is very lethargic however she is answering questions and being cooperative.  Appears to be in no discrete distress.  Ordered acetaminophen level, ethanol  level, salicylate level, tox panel, UDS, CMP, CBC, UA, COVID test, pregnancy test, and EKG.  Suicidal precautions in place.  Plan to consult poison control.  Spoke with poison control at 1800.   - They recommend watching out for anticholinergic symptoms.   - If she has any QRS widening above 140 give sodium bicarb bolus 1 to 2 mg/kg.  - If any seizures or agitation we can give benzos.   - They recommend getting initial EKG.  Any QRS widening that we notice repeat EKG.   - Recommend IV hydration.   - Recommend getting a Tylenol level at least 4 hours after ingestion which would be 1815.   - She can be medically cleared after 8 hours from ingestion given no adverse side effects.  This would be at approximately 2015.  On reevaluation, patient is still very lethargic.  She is more than 8 hours out of ingestion..  Reviewed labs and she is positive for opiates.  We will try giving Narcan to see if this wakes her up anymore.  She may need to be admitted for observation for overdose.  Narcan with no relief of lethargy.  Spoke with Dr. Kennon Rounds at [phone] with hospitalist team.  She accepts patient for admission.   Final Clinical Impression(s) / ED Diagnoses Final diagnoses:  Intentional drug overdose, initial encounter [facility])    Rx / DC Orders ED Discharge Orders     None        Adolphus Birchwood, PA-C 03/29/21 0012    Lorelle Gibbs, DO 03/29/21 1535

## 2021-03-29 ENCOUNTER — Other Ambulatory Visit: Payer: Self-pay

## 2021-03-29 DIAGNOSIS — T50902A Poisoning by unspecified drugs, medicaments and biological substances, intentional self-harm, initial encounter: Secondary | ICD-10-CM | POA: Diagnosis not present

## 2021-03-29 LAB — COMPREHENSIVE METABOLIC PANEL
ALT: 20 U/L (ref 0–44)
AST: 26 U/L (ref 15–41)
Albumin: 3.1 g/dL — ABNORMAL LOW (ref 3.5–5.0)
Alkaline Phosphatase: 60 U/L (ref 38–126)
Anion gap: 8 (ref 5–15)
BUN: 8 mg/dL (ref 6–20)
CO2: 26 mmol/L (ref 22–32)
Calcium: 8.5 mg/dL — ABNORMAL LOW (ref 8.9–10.3)
Chloride: 110 mmol/L (ref 98–111)
Creatinine, Ser: 0.55 mg/dL (ref 0.44–1.00)
GFR, Estimated: >60 mL/min (ref >60)
Glucose, Bld: 90 mg/dL (ref 70–99)
Potassium: 4.1 mmol/L (ref 3.5–5.1)
Sodium: 144 mmol/L (ref 135–145)
Total Bilirubin: 0.5 mg/dL (ref 0.3–1.2)
Total Protein: 5.7 g/dL — ABNORMAL LOW (ref 6.5–8.1)

## 2021-03-29 LAB — TSH: TSH: 2.113 u[IU]/mL (ref 0.350–4.500)

## 2021-03-29 LAB — CBC
HCT: 37.5 % (ref 36.0–46.0)
Hemoglobin: 11.8 g/dL — ABNORMAL LOW (ref 12.0–15.0)
MCH: 28.6 pg (ref 26.0–34.0)
MCHC: 31.5 g/dL (ref 30.0–36.0)
MCV: 90.8 fL (ref 80.0–100.0)
Platelets: 351 10*3/uL (ref 150–400)
RBC: 4.13 MIL/uL (ref 3.87–5.11)
RDW: 14.6 % (ref 11.5–15.5)
WBC: 10.3 10*3/uL (ref 4.0–10.5)
nRBC: 0 % (ref 0.0–0.2)

## 2021-03-29 LAB — ACETAMINOPHEN LEVEL: Acetaminophen (Tylenol), Serum: <10 ug/mL — ABNORMAL LOW (ref 10–30)

## 2021-03-29 MED ORDER — DICYCLOMINE HCL 20 MG PO TABS
20.0000 mg | ORAL_TABLET | Freq: Three times a day (TID) | ORAL | Status: DC | PRN
  Administered 2021-03-29: 20 mg via ORAL
  Filled 2021-03-29 (×2): qty 1

## 2021-03-29 MED ORDER — HYDROCERIN EX CREA
TOPICAL_CREAM | Freq: Two times a day (BID) | CUTANEOUS | Status: DC
  Administered 2021-03-30: 1 via TOPICAL
  Filled 2021-03-29: qty 113

## 2021-03-29 NOTE — Progress Notes (Signed)
PROGRESS NOTE    Jeanette Hall  [id number] DOB: [birth year] DOA: 03/28/2021 PCP: Pcp, No  Brief Narrative: 19 year old female with history of asthma and severe eczema, long history of feeling depressed, sad ..presented to the ED following an overdose.She drank half of a 118 mL bottle of children's Mucus Relief (contains acetaminophen, dextromethorphan, guaifenesin, phenylephrine), and half a bottle of 237 mL of Tessalon cough and chest congestion (dextromethorphan and guaifenesin) and swallowed approximately 45 tablets of 10 mg of dicyclomine. -In the ED, labs, LFTs were unremarkable, Tylenol level was 17, salicylate less than 7, UDS positive for opiates and cannabis -Poison control was contacted, recommended watching for anticholinergic symptoms, QTC, to use benzos if she develops seizures or agitation. -Patient was still noted to be lethargic, admitted to [facility]:   Drug overdose Suicide attempt Major depression -Involving multiple medications including Tylenol, dextromethorphan, guaifenesin, phenylephrine, dicyclomine, opiates -Mental status improving, LFTs unremarkable, QTC was normal will repeat, repeat Tylenol level -Admit to telemetry, continue one-to-one sitter -Will request psychiatry input  Severe eczema -Started hydrocortisone cream -Eucerin -Recommended dermatology follow-up, needs to be ruled out for scleroderma  Asthma -Stable, no wheezing, nebs as needed  DVT prophylaxis: Lovenox Code Status: full code Family Communication: Disposition Plan:  Status is: Inpatient  Remains inpatient appropriate because:Inpatient level of care appropriate due to severity of illness  Dispo: The patient is from: Home              Anticipated d/c is to: Likely [facility]              Patient currently is not medically stable to d/c.   Difficult to place patient No        Consultants:  Psychiatry, pending Poison control  Procedures:   Antimicrobials:     Subjective: -Feels better, had some dizziness overnight, denies any nausea or vomiting  Objective: Vitals:   03/29/21 0731 03/29/21 0804 03/29/21 0930 03/29/21 1043  BP:  125/67 110/64 105/60  Pulse:    61  Resp:    16  Temp: (!) 97.5 F (36.4 C)     TempSrc: Oral     SpO2:    100%  Height:        Intake/Output Summary (Last 24 hours) at 03/29/2021 1052 Last data filed at 03/29/2021 0221 Gross per 24 hour  Intake 1583.42 ml  Output --  Net 1583.42 ml   There were no vitals filed for this visit.  Examination:  General exam: Pleasant female, sitting up in bed, AAOx3, no distress HEENT: No JVD, no icterus CVS: S1-S2, regular rate rhythm Lungs: Clear bilaterally Abdomen: Soft, nontender, bowel sounds present Extremities: No edema Skin: Thickened hyperpigmented skin throughout especially involving neck, upper chest wall, eyelids etc.   Data Reviewed:   CBC: Recent Labs  Lab 03/28/21 1803 03/29/21 0537  WBC 13.6* 10.3  NEUTROABS 11.7*  --   HGB 13.1 11.8*  HCT 41.9 37.5  MCV 89.5 90.8  PLT 417* [id number]   Basic Metabolic Panel: Recent Labs  Lab 03/28/21 1803 03/28/21 1807 03/29/21 0650  NA 145  --  144  K 3.7  --  4.1  CL 112*  --  110  CO2 25  --  26  GLUCOSE 137*  --  90  BUN 10  --  8  CREATININE 0.83  --  0.55  CALCIUM 9.1  --  8.5*  MG  --  1.8  --    GFR: CrCl cannot be calculated (Unknown  ideal weight.). Liver Function Tests: Recent Labs  Lab 03/28/21 1803 03/29/21 0650  AST 33 26  ALT 26 20  ALKPHOS 79 60  BILITOT 0.6 0.5  PROT 7.0 5.7*  ALBUMIN 3.8 3.1*   No results for input(s): LIPASE, AMYLASE in the last 168 hours. No results for input(s): AMMONIA in the last 168 hours. Coagulation Profile: No results for input(s): INR, PROTIME in the last 168 hours. Cardiac Enzymes: No results for input(s): CKTOTAL, CKMB, CKMBINDEX, TROPONINI in the last 168 hours. BNP (last 3 results) No results for input(s): PROBNP in the last 8760  hours. HbA1C: No results for input(s): HGBA1C in the last 72 hours. CBG: Recent Labs  Lab 03/28/21 1809  GLUCAP 128*   Lipid Profile: No results for input(s): CHOL, HDL, LDLCALC, TRIG, CHOLHDL, LDLDIRECT in the last 72 hours. Thyroid Function Tests: Recent Labs    03/28/21 0537  TSH 2.113   Anemia Panel: No results for input(s): VITAMINB12, FOLATE, FERRITIN, TIBC, IRON, RETICCTPCT in the last 72 hours. Urine analysis:    Component Value Date/Time   COLORURINE YELLOW (A) 03/28/2021 2100   APPEARANCEUR CLEAR (A) 03/28/2021 2100   LABSPEC >1.030 (H) 03/28/2021 2100   PHURINE 6.0 03/28/2021 2100   GLUCOSEU NEGATIVE 03/28/2021 2100   HGBUR NEGATIVE 03/28/2021 2100   BILIRUBINUR NEGATIVE 03/28/2021 2100   BILIRUBINUR negative 02/02/2018 1126   KETONESUR TRACE (A) 03/28/2021 2100   PROTEINUR TRACE (A) 03/28/2021 2100   UROBILINOGEN 0.2 02/02/2018 1126   UROBILINOGEN 1.0 03/28/2014 2233   NITRITE NEGATIVE 03/28/2021 2100   LEUKOCYTESUR NEGATIVE 03/28/2021 2100   Sepsis Labs: '@LABRCNTIP'$ (procalcitonin:4,lacticidven:4)  ) Recent Results (from the past 240 hour(s))  Resp Panel by RT-PCR (Flu A&B, Covid) Nasopharyngeal Swab     Status: None   Collection Time: 03/28/21  6:10 PM   Specimen: Nasopharyngeal Swab; Nasopharyngeal(NP) swabs in vial transport medium  Result Value Ref Range Status   SARS Coronavirus 2 by RT PCR NEGATIVE NEGATIVE Final    Comment: (NOTE) SARS-CoV-2 target nucleic acids are NOT DETECTED.  The SARS-CoV-2 RNA is generally detectable in upper respiratory specimens during the acute phase of infection. The lowest concentration of SARS-CoV-2 viral copies this assay can detect is 138 copies/mL. A negative result does not preclude SARS-Cov-2 infection and should not be used as the sole basis for treatment or other patient management decisions. A negative result may occur with  improper specimen collection/handling, submission of specimen other than  nasopharyngeal swab, presence of viral mutation(s) within the areas targeted by this assay, and inadequate number of viral copies(<138 copies/mL). A negative result must be combined with clinical observations, patient history, and epidemiological information. The expected result is Negative.  Fact Sheet for Patients:  EntrepreneurPulse.com.au  Fact Sheet for Healthcare Providers:  IncredibleEmployment.be  This test is no t yet approved or cleared by the [place] FDA and  has been authorized for detection and/or diagnosis of SARS-CoV-2 by FDA under an Emergency Use Authorization (EUA). This EUA will remain  in effect (meaning this test can be used) for the duration of the COVID-19 declaration under Section 564(b)(1) of the Act, 21 U.S.C.section 360bbb-3(b)(1), unless the authorization is terminated  or revoked sooner.       Influenza A by PCR NEGATIVE NEGATIVE Final   Influenza B by PCR NEGATIVE NEGATIVE Final    Comment: (NOTE) The Xpert Xpress SARS-CoV-2/FLU/RSV plus assay is intended as an aid in the diagnosis of influenza from Nasopharyngeal swab specimens and should not be used as  a sole basis for treatment. Nasal washings and aspirates are unacceptable for Xpert Xpress SARS-CoV-2/FLU/RSV testing.  Fact Sheet for Patients: EntrepreneurPulse.com.au  Fact Sheet for Healthcare Providers: IncredibleEmployment.be  This test is not yet approved or cleared by the [place] FDA and has been authorized for detection and/or diagnosis of SARS-CoV-2 by FDA under an Emergency Use Authorization (EUA). This EUA will remain in effect (meaning this test can be used) for the duration of the COVID-19 declaration under Section 564(b)(1) of the Act, 21 U.S.C. section 360bbb-3(b)(1), unless the authorization is terminated or revoked.  Performed at [facility], [place] [address], [place], [place] [address]      Scheduled Meds:  enoxaparin (LOVENOX) injection  40 mg Subcutaneous Q24H   hydrocerin   Topical BID   hydrocortisone cream   Topical BID   Continuous Infusions:  lactated ringers 100 mL/hr at 03/29/21 0220     LOS: 1 day   Time spent: 59mn  PDomenic Polite MD [facility]   03/29/2021, 10:52 AM

## 2021-03-29 NOTE — ED Notes (Signed)
Patient assisted to bedside commode, patient feels lightheaded and unsteady on feet. Assisted back to bed and monitor replaced.

## 2021-03-29 NOTE — ED Notes (Signed)
Sitter at the bedside 1:1. Pt belongings received from nightshift RN and placed in cabinet labeled "pt belongings 16-18." Pt A&O x4 at this time. VSS. EKG obtained as per hospitalist's written order.

## 2021-03-29 NOTE — ED Notes (Signed)
Hospitalist at the bedside 

## 2021-03-29 NOTE — Progress Notes (Signed)
Pt complains of abdominal cramping. Provider contacted and order entered.

## 2021-03-29 NOTE — Progress Notes (Signed)
Patient arrived to the unit. Pt has no complaints at this time. She reports experiencing suicidal ideation. Pt says that she has been "feeling this way for the last 3 months". She also stated that she has been having SI due to eczema exacerbation and feeling as if she's " not a good parent". Will continue to monitor patient. Sitter remains at bedside.

## 2021-03-30 DIAGNOSIS — F322 Major depressive disorder, single episode, severe without psychotic features: Secondary | ICD-10-CM

## 2021-03-30 DIAGNOSIS — T50902A Poisoning by unspecified drugs, medicaments and biological substances, intentional self-harm, initial encounter: Secondary | ICD-10-CM | POA: Diagnosis not present

## 2021-03-30 LAB — CBC
HCT: 35.7 % — ABNORMAL LOW (ref 36.0–46.0)
Hemoglobin: 11.6 g/dL — ABNORMAL LOW (ref 12.0–15.0)
MCH: 28.3 pg (ref 26.0–34.0)
MCHC: 32.5 g/dL (ref 30.0–36.0)
MCV: 87.1 fL (ref 80.0–100.0)
Platelets: 315 10*3/uL (ref 150–400)
RBC: 4.1 MIL/uL (ref 3.87–5.11)
RDW: 14.5 % (ref 11.5–15.5)
WBC: 7.6 10*3/uL (ref 4.0–10.5)
nRBC: 0 % (ref 0.0–0.2)

## 2021-03-30 LAB — COMPREHENSIVE METABOLIC PANEL
ALT: 24 U/L (ref 0–44)
AST: 29 U/L (ref 15–41)
Albumin: 3.2 g/dL — ABNORMAL LOW (ref 3.5–5.0)
Alkaline Phosphatase: 65 U/L (ref 38–126)
Anion gap: 9 (ref 5–15)
BUN: 7 mg/dL (ref 6–20)
CO2: 26 mmol/L (ref 22–32)
Calcium: 8.7 mg/dL — ABNORMAL LOW (ref 8.9–10.3)
Chloride: 107 mmol/L (ref 98–111)
Creatinine, Ser: 0.73 mg/dL (ref 0.44–1.00)
GFR, Estimated: >60 mL/min (ref >60)
Glucose, Bld: 77 mg/dL (ref 70–99)
Potassium: 3.4 mmol/L — ABNORMAL LOW (ref 3.5–5.1)
Sodium: 142 mmol/L (ref 135–145)
Total Bilirubin: 0.6 mg/dL (ref 0.3–1.2)
Total Protein: 5.8 g/dL — ABNORMAL LOW (ref 6.5–8.1)

## 2021-03-30 MED ORDER — FLUOXETINE HCL 10 MG PO CAPS
10.0000 mg | ORAL_CAPSULE | Freq: Every day | ORAL | Status: DC
  Administered 2021-03-31: 10 mg via ORAL
  Filled 2021-03-30: qty 1

## 2021-03-30 NOTE — Progress Notes (Signed)
PROGRESS NOTE    Jeanette Hall  [id number] DOB: 2000-10-31 DOA: 03/28/2021 PCP: Pcp, No  Brief Narrative: 20 year old female with history of asthma and severe eczema, long history of feeling depressed, sad ..presented to the ED following an overdose.She drank half of a 118 mL bottle of children's Mucus Relief (contains acetaminophen, dextromethorphan, guaifenesin, phenylephrine), and half a bottle of 237 mL of Tessalon cough and chest congestion (dextromethorphan and guaifenesin) and swallowed approximately 45 tablets of 10 mg of dicyclomine. -In the ED, labs, LFTs were unremarkable, Tylenol level was 17, salicylate less than 7, UDS positive for opiates and cannabis -Poison control was contacted, recommended watching for anticholinergic symptoms, QTC, to use benzos if she develops seizures or agitation. -Patient was still noted to be lethargic, admitted to [facility]:   Drug overdose Suicide attempt Major depression -Involving multiple medications including Tylenol, dextromethorphan, guaifenesin, phenylephrine, dicyclomine, opiates -Mental status improved, LFTs unremarkable, QTC was normal will repeat, repeat Tylenol level <10 -Discontinue telemetry, continue one-to-one sitter -Psychiatry consult  Severe eczema -Started hydrocortisone cream -Eucerin -Recommended dermatology follow-up, needs to be ruled out for scleroderma  Asthma -Stable, no wheezing, nebs as needed  DVT prophylaxis: Lovenox Code Status: full code Family Communication: No family at bedside Disposition Plan:  Status is: Inpatient  Remains inpatient appropriate because:Inpatient level of care appropriate due to severity of illness  Dispo: The patient is from: Home              Anticipated d/c is to: Likely [facility]              Patient currently is not medically stable to d/c.   Difficult to place patient No  Consultants:  Psychiatry, pending Poison control  Procedures:   Antimicrobials:     Subjective: -Feels okay, some nausea yesterday, 1 episode of vomiting   Objective: Vitals:   03/29/21 1707 03/29/21 1845 03/29/21 2204 03/30/21 0320  BP:  (!) 102/54 114/73 (!) 115/59  Pulse:  65 81 (!) 57  Resp:  '18 19 16  '$ Temp:  98.5 F (36.9 C) 99.2 F (37.3 C) 99.4 F (37.4 C)  TempSrc:  Oral    SpO2:  97% 100% 96%  Height: '5\' 4"'$  (1.626 m)       Intake/Output Summary (Last 24 hours) at 03/30/2021 1042 Last data filed at 03/30/2021 0839 Gross per 24 hour  Intake 1259.01 ml  Output --  Net 1259.01 ml   There were no vitals filed for this visit.  Examination:  General exam: Gen: Awake, Alert, Oriented X 3, flat affect Lungs: Good air movement bilaterally, CTAB CVS: S1S2/RRR Abd: soft, Non tender, non distended, BS present Extremities: No edema Skin: Thickened hyperpigmented skin throughout especially involving neck, upper chest wall, face, eyelids etc.   Data Reviewed:   CBC: Recent Labs  Lab 03/28/21 1803 03/29/21 0537 03/30/21 0443  WBC 13.6* 10.3 7.6  NEUTROABS 11.7*  --   --   HGB 13.1 11.8* 11.6*  HCT 41.9 37.5 35.7*  MCV 89.5 90.8 87.1  PLT 417* 351 [id number]   Basic Metabolic Panel: Recent Labs  Lab 03/28/21 1803 03/28/21 1807 03/29/21 0650 03/30/21 0443  NA 145  --  144 142  K 3.7  --  4.1 3.4*  CL 112*  --  110 107  CO2 25  --  26 26  GLUCOSE 137*  --  90 77  BUN 10  --  8 7  CREATININE 0.83  --  0.55 0.73  CALCIUM  9.1  --  8.5* 8.7*  MG  --  1.8  --   --    GFR: CrCl cannot be calculated (Unknown ideal weight.). Liver Function Tests: Recent Labs  Lab 03/28/21 1803 03/29/21 0650 03/30/21 0443  AST 33 26 29  ALT '26 20 24  '$ ALKPHOS 79 60 65  BILITOT 0.6 0.5 0.6  PROT 7.0 5.7* 5.8*  ALBUMIN 3.8 3.1* 3.2*   No results for input(s): LIPASE, AMYLASE in the last 168 hours. No results for input(s): AMMONIA in the last 168 hours. Coagulation Profile: No results for input(s): INR, PROTIME in the last 168 hours. Cardiac Enzymes: No  results for input(s): CKTOTAL, CKMB, CKMBINDEX, TROPONINI in the last 168 hours. BNP (last 3 results) No results for input(s): PROBNP in the last 8760 hours. HbA1C: No results for input(s): HGBA1C in the last 72 hours. CBG: Recent Labs  Lab 03/28/21 1809  GLUCAP 128*   Lipid Profile: No results for input(s): CHOL, HDL, LDLCALC, TRIG, CHOLHDL, LDLDIRECT in the last 72 hours. Thyroid Function Tests: Recent Labs    03/28/21 0537  TSH 2.113   Anemia Panel: No results for input(s): VITAMINB12, FOLATE, FERRITIN, TIBC, IRON, RETICCTPCT in the last 72 hours. Urine analysis:    Component Value Date/Time   COLORURINE YELLOW (A) 03/28/2021 2100   APPEARANCEUR CLEAR (A) 03/28/2021 2100   LABSPEC >1.030 (H) 03/28/2021 2100   PHURINE 6.0 03/28/2021 2100   GLUCOSEU NEGATIVE 03/28/2021 2100   HGBUR NEGATIVE 03/28/2021 2100   BILIRUBINUR NEGATIVE 03/28/2021 2100   BILIRUBINUR negative 02/02/2018 1126   KETONESUR TRACE (A) 03/28/2021 2100   PROTEINUR TRACE (A) 03/28/2021 2100   UROBILINOGEN 0.2 02/02/2018 1126   UROBILINOGEN 1.0 03/28/2014 2233   NITRITE NEGATIVE 03/28/2021 2100   LEUKOCYTESUR NEGATIVE 03/28/2021 2100   Sepsis Labs: '@LABRCNTIP'$ (procalcitonin:4,lacticidven:4)  ) Recent Results (from the past 240 hour(s))  Resp Panel by RT-PCR (Flu A&B, Covid) Nasopharyngeal Swab     Status: None   Collection Time: 03/28/21  6:10 PM   Specimen: Nasopharyngeal Swab; Nasopharyngeal(NP) swabs in vial transport medium  Result Value Ref Range Status   SARS Coronavirus 2 by RT PCR NEGATIVE NEGATIVE Final    Comment: (NOTE) SARS-CoV-2 target nucleic acids are NOT DETECTED.  The SARS-CoV-2 RNA is generally detectable in upper respiratory specimens during the acute phase of infection. The lowest concentration of SARS-CoV-2 viral copies this assay can detect is 138 copies/mL. A negative result does not preclude SARS-Cov-2 infection and should not be used as the sole basis for treatment  or other patient management decisions. A negative result may occur with  improper specimen collection/handling, submission of specimen other than nasopharyngeal swab, presence of viral mutation(s) within the areas targeted by this assay, and inadequate number of viral copies(<138 copies/mL). A negative result must be combined with clinical observations, patient history, and epidemiological information. The expected result is Negative.  Fact Sheet for Patients:  EntrepreneurPulse.com.au  Fact Sheet for Healthcare Providers:  IncredibleEmployment.be  This test is no t yet approved or cleared by the [place] FDA and  has been authorized for detection and/or diagnosis of SARS-CoV-2 by FDA under an Emergency Use Authorization (EUA). This EUA will remain  in effect (meaning this test can be used) for the duration of the COVID-19 declaration under Section 564(b)(1) of the Act, 21 U.S.C.section 360bbb-3(b)(1), unless the authorization is terminated  or revoked sooner.       Influenza A by PCR NEGATIVE NEGATIVE Final   Influenza B by PCR  NEGATIVE NEGATIVE Final    Comment: (NOTE) The Xpert Xpress SARS-CoV-2/FLU/RSV plus assay is intended as an aid in the diagnosis of influenza from Nasopharyngeal swab specimens and should not be used as a sole basis for treatment. Nasal washings and aspirates are unacceptable for Xpert Xpress SARS-CoV-2/FLU/RSV testing.  Fact Sheet for Patients: EntrepreneurPulse.com.au  Fact Sheet for Healthcare Providers: IncredibleEmployment.be  This test is not yet approved or cleared by the [place] FDA and has been authorized for detection and/or diagnosis of SARS-CoV-2 by FDA under an Emergency Use Authorization (EUA). This EUA will remain in effect (meaning this test can be used) for the duration of the COVID-19 declaration under Section 564(b)(1) of the Act, 21 U.S.C. section  360bbb-3(b)(1), unless the authorization is terminated or revoked.  Performed at [facility], [place] [address], [place], [place] [address]      Scheduled Meds:  enoxaparin (LOVENOX) injection  40 mg Subcutaneous Q24H   hydrocerin   Topical BID   hydrocortisone cream   Topical BID   Continuous Infusions:     LOS: 2 days   Time spent: 80mn  PDomenic Polite MD [facility]   03/30/2021, 10:42 AM

## 2021-03-30 NOTE — Consult Note (Signed)
[facility] Consult   Reason for Consult: ''Overdose, Suicide attempt.'' Referring Physician:  Domenic Polite, MD Patient Identification: Jeanette Hall MRN:  [id number] Principal Diagnosis: Intentional overdose of drug in tablet form [facility]) Diagnosis:  Principal Problem:   Intentional overdose of drug in tablet form ([place]) Active Problems:   Eczema   Asthma   Suicide attempt ([place])   Major depressive disorder, single episode, severe without psychosis ([place])   Total Time spent with patient: 1 hour  Subjective:   Jeanette Hall is a 20 y.o. female patient admitted with intentional drug overdose.  HPI:  Patient is a 20 year old female with history of asthma and severe eczema who was admitted to the hospital after she intentionally overdosed on multiple medications in an attempt to take her life. Patient is alert, awake, oriented x 4, she reports that she drank half of a 118 mL bottle of children's Mucus Relief (contains acetaminophen, dextromethorphan, guaifenesin, phenylephrine), half a bottle of 237 mL of Tessalon cough and chest congestion (dextromethorphan and guaifenesin) and swallowed approximately 45 tablets of 10 mg of dicyclomine.Prior to suicide attempt, patient reports being overwhelmed and stressed out. Her stressors include difficulty taking care of her 63 year old son(now with baby father), loosing her job 2 months ago, and being homeless due to inability to pay her rent due to poor financial status. She reports dealing with depression for the past 1.5 years without seeking help. She is endorsing hopelessness, low energy level, poor sleep, feeling worthless, poor appetite and recurrent suicidal thoughts with plan to overdose. She denies alcohol use but admits to smoking cannabis occasionally(Urine toxicology is positive for Opiates and THC).  Past Psychiatric History: None in the past  Risk to Self:  yes Risk to Others:  denies Prior Inpatient Therapy:  none  reported Prior Outpatient Therapy:  none reported  Past Medical History:  Past Medical History:  Diagnosis Date   Angio-edema    Asthma    Eczema    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    Past Surgical History:  Procedure Laterality Date   MASS EXCISION N/A 05/05/2016   Procedure: EXCISION  OF TONGUE MASS;  Surgeon: Leta Baptist, MD;  Location: [place];  Service: ENT;  Laterality: N/A;  EXCISION  OF TONGUE MASS   Family History:  Family History  Problem Relation Age of Onset   Asthma Maternal Uncle    Hypertension Maternal Grandmother    Asthma Maternal Grandmother    Cancer Maternal Grandmother    Diabetes Maternal Grandmother    Cancer Paternal Grandmother    Hypertension Mother    Family Psychiatric  History:  Social History:  Social History   Substance and Sexual Activity  Alcohol Use No     Social History   Substance and Sexual Activity  Drug Use Yes   Types: Marijuana    Social History   Socioeconomic History   Marital status: Single    Spouse name: Not on file   Number of children: Not on file   Years of education: Not on file   Highest education level: Not on file  Occupational History   Not on file  Tobacco Use   Smoking status: Never   Smokeless tobacco: Never   Tobacco comments:    stepfather smokes outside  Vaping Use   Vaping Use: Never used  Substance and Sexual Activity   Alcohol use: No   Drug use: Yes    Types: Marijuana   Sexual activity: Yes  Partners: Male    Birth control/protection: None, I.U.D.  Other Topics Concern   Not on file  Social History Narrative   Not on file   Social Determinants of Health   Financial Resource Strain: Not on file  Food Insecurity: Not on file  Transportation Needs: Not on file  Physical Activity: Not on file  Stress: Not on file  Social Connections: Not on file   Additional Social History:    Allergies:   Allergies  Allergen Reactions   Fish Allergy Swelling    LIPS AND EYES     Labs:  Results for orders placed or performed during the hospital encounter of 03/28/21 (from the past 48 hour(s))  Comprehensive metabolic panel     Status: Abnormal   Collection Time: 03/28/21  6:03 PM  Result Value Ref Range   Sodium 145 135 - 145 mmol/L   Potassium 3.7 3.5 - 5.1 mmol/L   Chloride 112 (H) 98 - 111 mmol/L   CO2 25 22 - 32 mmol/L   Glucose, Bld 137 (H) 70 - 99 mg/dL    Comment: Glucose reference range applies only to samples taken after fasting for at least 8 hours.   BUN 10 6 - 20 mg/dL   Creatinine, Ser 0.83 0.44 - 1.00 mg/dL   Calcium 9.1 8.9 - 10.3 mg/dL   Total Protein 7.0 6.5 - 8.1 g/dL   Albumin 3.8 3.5 - 5.0 g/dL   AST 33 15 - 41 U/L   ALT 26 0 - 44 U/L   Alkaline Phosphatase 79 38 - 126 U/L   Total Bilirubin 0.6 0.3 - 1.2 mg/dL   GFR, Estimated >60 >60 mL/min    Comment: (NOTE) Calculated using the CKD-EPI Creatinine Equation (2021)    Anion gap 8 5 - 15    Comment: Performed at [facility], [place] [address], [place], [place] [address]  Ethanol     Status: None   Collection Time: 03/28/21  6:03 PM  Result Value Ref Range   Alcohol, Ethyl (B) <10 <10 mg/dL    Comment: (NOTE) Lowest detectable limit for serum alcohol is 10 mg/dL.  For medical purposes only. Performed at [facility], [place] [address], [place], [place] [address]   CBC with Diff     Status: Abnormal   Collection Time: 03/28/21  6:03 PM  Result Value Ref Range   WBC 13.6 (H) 4.0 - 10.5 K/uL   RBC 4.68 3.87 - 5.11 MIL/uL   Hemoglobin 13.1 12.0 - 15.0 g/dL   HCT 41.9 36.0 - 46.0 %   MCV 89.5 80.0 - 100.0 fL   MCH 28.0 26.0 - 34.0 pg   MCHC 31.3 30.0 - 36.0 g/dL   RDW 14.7 11.5 - 15.5 %   Platelets 417 (H) 150 - 400 K/uL   nRBC 0.0 0.0 - 0.2 %   Neutrophils Relative % 88 %   Neutro Abs 11.7 (H) 1.7 - 7.7 K/uL   Lymphocytes Relative 7 %   Lymphs Abs 1.0 0.7 - 4.0 K/uL   Monocytes Relative 4 %   Monocytes Absolute 0.6 0.1 - 1.0 K/uL    Eosinophils Relative 1 %   Eosinophils Absolute 0.2 0.0 - 0.5 K/uL   Basophils Relative 0 %   Basophils Absolute 0.0 0.0 - 0.1 K/uL   Immature Granulocytes 0 %   Abs Immature Granulocytes 0.06 0.00 - 0.07 K/uL    Comment: Performed at [facility], [place] [address]  [place], [place] [id number]  Salicylate level     Status: Abnormal   Collection Time: 03/28/21  6:03 PM  Result Value Ref Range   Salicylate Lvl Q000111Q (L) 7.0 - 30.0 mg/dL    Comment: Performed at [facility], [place] [address] [place], [place] [address]  Acetaminophen level     Status: None   Collection Time: 03/28/21  6:03 PM  Result Value Ref Range   Acetaminophen (Tylenol), Serum 17 10 - 30 ug/mL    Comment: (NOTE) Therapeutic concentrations vary significantly. A range of 10-30 ug/mL  may be an effective concentration for many patients. However, some  are best treated at concentrations outside of this range. Acetaminophen concentrations >150 ug/mL at 4 hours after ingestion  and >50 ug/mL at 12 hours after ingestion are often associated with  toxic reactions.  Performed at [facility], [place] [address] [place], [place] [address]   Magnesium     Status: None   Collection Time: 03/28/21  6:07 PM  Result Value Ref Range   Magnesium 1.8 1.7 - 2.4 mg/dL    Comment: Performed at [facility], [place] [address] [place], [place] [address]  CBG monitoring, ED     Status: Abnormal   Collection Time: 03/28/21  6:09 PM  Result Value Ref Range   Glucose-Capillary 128 (H) 70 - 99 mg/dL    Comment: Glucose reference range applies only to samples taken after fasting for at least 8 hours.  Resp Panel by RT-PCR (Flu A&B, Covid) Nasopharyngeal Swab     Status: None   Collection Time: 03/28/21  6:10 PM   Specimen: Nasopharyngeal Swab; Nasopharyngeal(NP) swabs in vial transport medium  Result Value Ref Range   SARS Coronavirus 2 by RT PCR NEGATIVE NEGATIVE     Comment: (NOTE) SARS-CoV-2 target nucleic acids are NOT DETECTED.  The SARS-CoV-2 RNA is generally detectable in upper respiratory specimens during the acute phase of infection. The lowest concentration of SARS-CoV-2 viral copies this assay can detect is 138 copies/mL. A negative result does not preclude SARS-Cov-2 infection and should not be used as the sole basis for treatment or other patient management decisions. A negative result may occur with  improper specimen collection/handling, submission of specimen other than nasopharyngeal swab, presence of viral mutation(s) within the areas targeted by this assay, and inadequate number of viral copies(<138 copies/mL). A negative result must be combined with clinical observations, patient history, and epidemiological information. The expected result is Negative.  Fact Sheet for Patients:  EntrepreneurPulse.com.au  Fact Sheet for Healthcare Providers:  IncredibleEmployment.be  This test is no t yet approved or cleared by the [place] FDA and  has been authorized for detection and/or diagnosis of SARS-CoV-2 by FDA under an Emergency Use Authorization (EUA). This EUA will remain  in effect (meaning this test can be used) for the duration of the COVID-19 declaration under Section 564(b)(1) of the Act, 21 U.S.C.section 360bbb-3(b)(1), unless the authorization is terminated  or revoked sooner.       Influenza A by PCR NEGATIVE NEGATIVE   Influenza B by PCR NEGATIVE NEGATIVE    Comment: (NOTE) The Xpert Xpress SARS-CoV-2/FLU/RSV plus assay is intended as an aid in the diagnosis of influenza from Nasopharyngeal swab specimens and should not be used as a sole basis for treatment. Nasal washings and aspirates are unacceptable for Xpert Xpress SARS-CoV-2/FLU/RSV testing.  Fact Sheet for Patients: EntrepreneurPulse.com.au  Fact Sheet for Healthcare  Providers: IncredibleEmployment.be  This test is  not yet approved or cleared by the [place] and has been authorized for detection and/or diagnosis of SARS-CoV-2 by FDA under an Emergency Use Authorization (EUA). This EUA will remain in effect (meaning this test can be used) for the duration of the COVID-19 declaration under Section 564(b)(1) of the Act, 21 U.S.C. section 360bbb-3(b)(1), unless the authorization is terminated or revoked.  Performed at [facility], [place] [address] [place], [place] [address]   I-Stat beta hCG blood, ED     Status: None   Collection Time: 03/28/21  6:13 PM  Result Value Ref Range   I-stat hCG, quantitative <5.0 <5 mIU/mL   Comment 3            Comment:   GEST. AGE      CONC.  (mIU/mL)   <=1 WEEK        5 - 50     2 WEEKS       50 - 500     3 WEEKS       100 - 10,000     4 WEEKS     1,000 - 30,000        FEMALE AND NON-PREGNANT FEMALE:     LESS THAN 5 mIU/mL   Urine rapid drug screen (hosp performed)     Status: Abnormal   Collection Time: 03/28/21  9:00 PM  Result Value Ref Range   Opiates POSITIVE (A) NONE DETECTED   Cocaine NONE DETECTED NONE DETECTED   Benzodiazepines NONE DETECTED NONE DETECTED   Amphetamines NONE DETECTED NONE DETECTED   Tetrahydrocannabinol POSITIVE (A) NONE DETECTED   Barbiturates NONE DETECTED NONE DETECTED    Comment: (NOTE) DRUG SCREEN FOR MEDICAL PURPOSES ONLY.  IF CONFIRMATION IS NEEDED FOR ANY PURPOSE, NOTIFY LAB WITHIN 5 DAYS.  LOWEST DETECTABLE LIMITS FOR URINE DRUG SCREEN Drug Class                     Cutoff (ng/mL) Amphetamine and metabolites    1000 Barbiturate and metabolites    200 Benzodiazepine                 [id number] Tricyclics and metabolites     300 Opiates and metabolites        300 Cocaine and metabolites        300 THC                            50 Performed at [facility], [place] [address] [place], [place] [address]    Urinalysis, Routine w reflex microscopic     Status: Abnormal   Collection Time: 03/28/21  9:00 PM  Result Value Ref Range   Color, Urine YELLOW (A) YELLOW   APPearance CLEAR (A) CLEAR   Specific Gravity, Urine >1.030 (H) 1.005 - 1.030   pH 6.0 5.0 - 8.0   Glucose, UA NEGATIVE NEGATIVE mg/dL   Hgb urine dipstick NEGATIVE NEGATIVE   Bilirubin Urine NEGATIVE NEGATIVE   Ketones, ur TRACE (A) NEGATIVE mg/dL   Protein, ur TRACE (A) NEGATIVE mg/dL   Nitrite NEGATIVE NEGATIVE   Leukocytes,Ua NEGATIVE NEGATIVE   RBC / HPF 0-5 0 - 5 RBC/hpf   WBC, UA 11-20 0 - 5 WBC/hpf   Bacteria, UA NONE SEEN NONE SEEN   Squamous Epithelial / LPF 21-50 0 - 5   Mucus PRESENT     Comment: Performed at [facility],  [place] [address], [place], [place] [address]  CBC     Status: Abnormal   Collection Time: 03/29/21  5:37 AM  Result Value Ref Range   WBC 10.3 4.0 - 10.5 K/uL   RBC 4.13 3.87 - 5.11 MIL/uL   Hemoglobin 11.8 (L) 12.0 - 15.0 g/dL   HCT 37.5 36.0 - 46.0 %   MCV 90.8 80.0 - 100.0 fL   MCH 28.6 26.0 - 34.0 pg   MCHC 31.5 30.0 - 36.0 g/dL   RDW 14.6 11.5 - 15.5 %   Platelets 351 150 - 400 K/uL   nRBC 0.0 0.0 - 0.2 %    Comment: Performed at [facility], [place] [address], [place], [place] [address]  Comprehensive metabolic panel     Status: Abnormal   Collection Time: 03/29/21  6:50 AM  Result Value Ref Range   Sodium 144 135 - 145 mmol/L   Potassium 4.1 3.5 - 5.1 mmol/L   Chloride 110 98 - 111 mmol/L   CO2 26 22 - 32 mmol/L   Glucose, Bld 90 70 - 99 mg/dL    Comment: Glucose reference range applies only to samples taken after fasting for at least 8 hours.   BUN 8 6 - 20 mg/dL   Creatinine, Ser 0.55 0.44 - 1.00 mg/dL   Calcium 8.5 (L) 8.9 - 10.3 mg/dL   Total Protein 5.7 (L) 6.5 - 8.1 g/dL   Albumin 3.1 (L) 3.5 - 5.0 g/dL   AST 26 15 - 41 U/L   ALT 20 0 - 44 U/L   Alkaline Phosphatase 60 38 - 126 U/L   Total Bilirubin 0.5 0.3 - 1.2 mg/dL   GFR,  Estimated >60 >60 mL/min    Comment: (NOTE) Calculated using the CKD-EPI Creatinine Equation (2021)    Anion gap 8 5 - 15    Comment: Performed at [facility], [place] [address], [place], [place] [address]  Acetaminophen level     Status: Abnormal   Collection Time: 03/29/21  1:56 PM  Result Value Ref Range   Acetaminophen (Tylenol), Serum <10 (L) 10 - 30 ug/mL    Comment: (NOTE) Therapeutic concentrations vary significantly. A range of 10-30 ug/mL  may be an effective concentration for many patients. However, some  are best treated at concentrations outside of this range. Acetaminophen concentrations >150 ug/mL at 4 hours after ingestion  and >50 ug/mL at 12 hours after ingestion are often associated with  toxic reactions.  Performed at [facility], [place] [address], [place], [place] [address]   CBC     Status: Abnormal   Collection Time: 03/30/21  4:43 AM  Result Value Ref Range   WBC 7.6 4.0 - 10.5 K/uL   RBC 4.10 3.87 - 5.11 MIL/uL   Hemoglobin 11.6 (L) 12.0 - 15.0 g/dL   HCT 35.7 (L) 36.0 - 46.0 %   MCV 87.1 80.0 - 100.0 fL   MCH 28.3 26.0 - 34.0 pg   MCHC 32.5 30.0 - 36.0 g/dL   RDW 14.5 11.5 - 15.5 %   Platelets 315 150 - 400 K/uL   nRBC 0.0 0.0 - 0.2 %    Comment: Performed at [facility], [place] [address], [place], [place] [address]  Comprehensive metabolic panel     Status: Abnormal   Collection Time: 03/30/21  4:43 AM  Result Value Ref Range   Sodium 142 135 - 145 mmol/L   Potassium 3.4 (L) 3.5 -  5.1 mmol/L    Comment: DELTA CHECK NOTED   Chloride 107 98 - 111 mmol/L   CO2 26 22 - 32 mmol/L   Glucose, Bld 77 70 - 99 mg/dL    Comment: Glucose reference range applies only to samples taken after fasting for at least 8 hours.   BUN 7 6 - 20 mg/dL   Creatinine, Ser 0.73 0.44 - 1.00 mg/dL   Calcium 8.7 (L) 8.9 - 10.3 mg/dL   Total Protein 5.8 (L) 6.5 - 8.1 g/dL   Albumin 3.2 (L) 3.5 - 5.0 g/dL   AST 29 15 - 41  U/L   ALT 24 0 - 44 U/L   Alkaline Phosphatase 65 38 - 126 U/L   Total Bilirubin 0.6 0.3 - 1.2 mg/dL   GFR, Estimated >60 >60 mL/min    Comment: (NOTE) Calculated using the CKD-EPI Creatinine Equation (2021)    Anion gap 9 5 - 15    Comment: Performed at [facility], [place] [address] [place], [place] [address]    Current Facility-Administered Medications  Medication Dose Route Frequency Provider Last Rate Last Admin   albuterol (PROVENTIL) (2.5 MG/3ML) 0.083% nebulizer solution 2.5 mg  2.5 mg Nebulization Q6H PRN Donnamae Jude, MD       dicyclomine (BENTYL) tablet 20 mg  20 mg Oral TID PRN Domenic Polite, MD   20 mg at 03/29/21 1904   enoxaparin (LOVENOX) injection 40 mg  40 mg Subcutaneous Q24H Donnamae Jude, MD       [START ON 03/31/2021] FLUoxetine (PROZAC) capsule 10 mg  10 mg Oral Daily Corena Pilgrim, MD       hydrocerin (EUCERIN) cream   Topical BID Domenic Polite, MD   1 application at Q000111Q 0950   hydrocortisone cream 1 %   Topical BID Donnamae Jude, MD   1 application at Q000111Q 0951   ibuprofen (ADVIL) tablet 400 mg  400 mg Oral Q6H PRN Donnamae Jude, MD   400 mg at 03/30/21 0455   polyethylene glycol (MIRALAX / GLYCOLAX) packet 17 g  17 g Oral Daily PRN Donnamae Jude, MD       traZODone (DESYREL) tablet 25 mg  25 mg Oral QHS PRN Donnamae Jude, MD   25 mg at 03/29/21 2305    Musculoskeletal: Strength & Muscle Tone: within normal limits Gait & Station: normal Patient leans: N/A    Psychiatric Specialty Exam:  Presentation  General Appearance: Appropriate for Environment  Eye Contact:Fair  Speech:Normal Rate  Speech Volume:Decreased  Handedness:Right   Mood and Affect  Mood:Dysphoric  Affect:Constricted   Thought Process  Thought Processes:Coherent; Linear  Descriptions of Associations:Intact  Orientation:Full (Time, Place and Person)  Thought Content:Logical  History of Schizophrenia/Schizoaffective disorder:No  data recorded Duration of Psychotic Symptoms:No data recorded Hallucinations:Hallucinations: None  Ideas of Reference:None  Suicidal Thoughts:Suicidal Thoughts: Yes, Active SI Active Intent and/or Plan: With Plan  Homicidal Thoughts:Homicidal Thoughts: No   Sensorium  Memory:Immediate Good; Recent Good; Remote Good  Judgment:Poor  Insight:Fair   Executive Functions  Concentration:Good  Attention Span:Good  [place] of Knowledge:Good  Language:Good   Psychomotor Activity  Psychomotor Activity:Psychomotor Activity: Psychomotor Retardation   Assets  Assets:Desire for Improvement   Sleep  Sleep:Sleep: Poor   Physical Exam: Physical Exam ROS Blood pressure (!) 115/59, pulse (!) 57, temperature 99.4 F (37.4 C), resp. rate 16, height '5\' 4"'$  (1.626 m), SpO2 96 %. Body mass index is 24.89 kg/m.  Treatment Plan  Summary: 20 year old female who denies prior history of mental illness, she was admitted after she attempted to commit suicide by intentionally overdosed on multiple medications. Today, she is alert, awake, oriented but remains depressed, suicidal and unable to contract for safety. She will benefit from inpatient psychiatric admission after she is medically stable.  Recommendations: -Continue 1:1 sitter for safety -Consider Prozac 10 mg daily for depression once patient is able to take oral medication. -Consider TOC/Social worker consult to facilitate inpatient psychiatric referral   Disposition: Recommend psychiatric Inpatient admission when medically cleared. Supportive therapy provided about ongoing stressors. Psychiatric service will follow patient as needed  Corena Pilgrim, MD 03/30/2021 12:47 PM

## 2021-03-30 NOTE — Progress Notes (Addendum)
Patient had questions regarding plan of care and new medications. Stated she wasn't aware of any plans or new medications. Patient informed of new medication that will be started, prozac, and given medication handout. Patient also made aware of plan of care according to psychiatry notes.   This RN also called patient's mother as well, per patient's request, to go over plan of care as well and patient's new medication. Questions answered to the best of my ability, will continue to monitor.

## 2021-03-30 NOTE — Plan of Care (Signed)
  Problem: Education: Goal: Knowledge of General Education information will improve Description: Including pain rating scale, medication(s)/side effects and non-pharmacologic comfort measures Outcome: Progressing   Problem: Clinical Measurements: Goal: Will remain free from infection Outcome: Progressing   Problem: Coping: Goal: Level of anxiety will decrease Outcome: Progressing   Problem: Pain Managment: Goal: General experience of comfort will improve Outcome: Progressing   Problem: Safety: Goal: Ability to remain free from injury will improve Outcome: Progressing   Problem: Coping: Goal: Ability to disclose and discuss thoughts of suicide and self-harm will improve Outcome: Progressing

## 2021-03-31 ENCOUNTER — Inpatient Hospital Stay (HOSPITAL_COMMUNITY)
Admission: RE | Admit: 2021-03-31 | Discharge: 2021-04-03 | DRG: 882 | Disposition: A | Discharge status: Home / Self Care | Payer: Medicaid Other | Attending: Emergency Medicine | Admitting: Emergency Medicine

## 2021-03-31 ENCOUNTER — Encounter (HOSPITAL_COMMUNITY): Payer: Self-pay | Admitting: Emergency Medicine

## 2021-03-31 ENCOUNTER — Other Ambulatory Visit: Payer: Self-pay

## 2021-03-31 DIAGNOSIS — N760 Acute vaginitis: Secondary | ICD-10-CM | POA: Diagnosis present

## 2021-03-31 DIAGNOSIS — F4325 Adjustment disorder with mixed disturbance of emotions and conduct: Secondary | ICD-10-CM

## 2021-03-31 DIAGNOSIS — F332 Major depressive disorder, recurrent severe without psychotic features: Secondary | ICD-10-CM | POA: Diagnosis not present

## 2021-03-31 DIAGNOSIS — Z20822 Contact with and (suspected) exposure to covid-19: Secondary | ICD-10-CM | POA: Diagnosis not present

## 2021-03-31 DIAGNOSIS — L309 Dermatitis, unspecified: Secondary | ICD-10-CM | POA: Diagnosis not present

## 2021-03-31 DIAGNOSIS — Z818 Family history of other mental and behavioral disorders: Secondary | ICD-10-CM

## 2021-03-31 DIAGNOSIS — G4700 Insomnia, unspecified: Secondary | ICD-10-CM | POA: Diagnosis present

## 2021-03-31 DIAGNOSIS — F419 Anxiety disorder, unspecified: Secondary | ICD-10-CM | POA: Diagnosis present

## 2021-03-31 DIAGNOSIS — Z23 Encounter for immunization: Secondary | ICD-10-CM | POA: Diagnosis not present

## 2021-03-31 DIAGNOSIS — K5900 Constipation, unspecified: Secondary | ICD-10-CM | POA: Diagnosis present

## 2021-03-31 DIAGNOSIS — T50902A Poisoning by unspecified drugs, medicaments and biological substances, intentional self-harm, initial encounter: Secondary | ICD-10-CM | POA: Diagnosis not present

## 2021-03-31 DIAGNOSIS — D649 Anemia, unspecified: Secondary | ICD-10-CM | POA: Diagnosis present

## 2021-03-31 DIAGNOSIS — Z9151 Personal history of suicidal behavior: Secondary | ICD-10-CM | POA: Diagnosis present

## 2021-03-31 HISTORY — DX: Adjustment disorder with mixed disturbance of emotions and conduct: F43.25

## 2021-03-31 LAB — RESP PANEL BY RT-PCR (FLU A&B, COVID) ARPGX2
Influenza A by PCR: NEGATIVE
Influenza B by PCR: NEGATIVE
SARS Coronavirus 2 by RT PCR: NEGATIVE

## 2021-03-31 MED ORDER — TRAZODONE HCL 50 MG PO TABS
25.0000 mg | ORAL_TABLET | Freq: Every evening | ORAL | Status: DC | PRN
  Administered 2021-03-31: 25 mg via ORAL
  Filled 2021-03-31: qty 1

## 2021-03-31 MED ORDER — HYDROXYZINE HCL 25 MG PO TABS
25.0000 mg | ORAL_TABLET | Freq: Three times a day (TID) | ORAL | Status: DC | PRN
  Administered 2021-03-31 – 2021-04-03 (×6): 25 mg via ORAL
  Filled 2021-03-31 (×6): qty 1

## 2021-03-31 MED ORDER — IBUPROFEN 400 MG PO TABS: 400.0000 mg | ORAL_TABLET | Freq: Four times a day (QID) | ORAL | Status: DC | PRN

## 2021-03-31 MED ORDER — POLYETHYLENE GLYCOL 3350 17 G PO PACK: 17.0000 g | PACK | Freq: Every day | ORAL | 0 refills | Status: DC | PRN

## 2021-03-31 MED ORDER — FLUOXETINE HCL 10 MG PO CAPS
10.0000 mg | ORAL_CAPSULE | Freq: Every day | ORAL | Status: DC
  Administered 2021-04-01 – 2021-04-03 (×3): 10 mg via ORAL
  Filled 2021-03-31 (×5): qty 1

## 2021-03-31 MED ORDER — HYDROCERIN EX CREA: TOPICAL_CREAM | Freq: Two times a day (BID) | CUTANEOUS | Status: DC

## 2021-03-31 MED ORDER — DICYCLOMINE HCL 20 MG PO TABS: 20.0000 mg | ORAL_TABLET | Freq: Three times a day (TID) | ORAL | Status: DC | PRN

## 2021-03-31 MED ORDER — HYDROCERIN EX CREA
TOPICAL_CREAM | Freq: Two times a day (BID) | CUTANEOUS | Status: DC
  Filled 2021-03-31 (×3): qty 113

## 2021-03-31 MED ORDER — HYDROCERIN EX CREA
1.0000 | TOPICAL_CREAM | Freq: Two times a day (BID) | CUTANEOUS | 0 refills | Status: DC
Start: 2021-03-31 — End: 2022-04-07

## 2021-03-31 MED ORDER — ALUM & MAG HYDROXIDE-SIMETH 200-200-20 MG/5ML PO SUSP: 30.0000 mL | ORAL | Status: DC | PRN

## 2021-03-31 MED ORDER — BISACODYL 10 MG RE SUPP: 10.0000 mg | Freq: Once | RECTAL | Status: DC

## 2021-03-31 MED ORDER — TRAZODONE HCL 50 MG PO TABS: 25.0000 mg | ORAL_TABLET | Freq: Every evening | ORAL | Status: DC | PRN

## 2021-03-31 MED ORDER — HYDROCORTISONE 1 % EX CREA: TOPICAL_CREAM | Freq: Two times a day (BID) | CUTANEOUS | 0 refills | Status: DC

## 2021-03-31 MED ORDER — FLUOXETINE HCL 10 MG PO CAPS: 10.0000 mg | ORAL_CAPSULE | Freq: Every day | ORAL | 3 refills | Status: DC

## 2021-03-31 MED ORDER — ALBUTEROL SULFATE (2.5 MG/3ML) 0.083% IN NEBU: 2.5000 mg | INHALATION_SOLUTION | Freq: Four times a day (QID) | RESPIRATORY_TRACT | Status: DC | PRN

## 2021-03-31 MED ORDER — HYDROCORTISONE 1 % EX CREA
1.0000 "application " | TOPICAL_CREAM | Freq: Two times a day (BID) | CUTANEOUS | Status: DC
  Administered 2021-03-31 – 2021-04-03 (×6): 1 via TOPICAL
  Filled 2021-03-31 (×2): qty 28

## 2021-03-31 NOTE — Progress Notes (Deleted)
The patient's positive event for the day is that she went outside for fresh air. Her goal for tomorrow is to "start opening up".

## 2021-03-31 NOTE — TOC Transition Note (Addendum)
Transition of Care [facility]) - CM/SW Discharge Note   Patient Details  Name: Jeanette Hall MRN: [id number] Date of Birth: [birth year]  [facility] CM/SW Contact:  Leeroy Cha, RN Phone Number: 03/31/2021, 2:03 PM   Clinical Narrative:    Patient transferred to [facility] Voluntary admission form faxed to [phone]. Patient will be transported to [facility] via safe transport. Safe transport called for transfer at 230. Final next level of care: Psychiatric Hospital Barriers to Discharge: No Barriers Identified   Patient Goals and CMS Choice Patient states their goals for this hospitalization and ongoing recovery are:: to get help CMS Medicare.gov Compare Post Acute Care list provided to:: Patient    Discharge Placement                       Discharge Plan and Services   Discharge Planning Services: CM Consult                                 Social Determinants of Health (SDOH) Interventions     Readmission Risk Interventions No flowsheet data found.
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
## 2021-03-31 NOTE — Tx Team (Signed)
Initial Treatment Plan 03/31/2021 4:33 PM Donnella Bi [id number]    PATIENT STRESSORS: Other: feels she has no purpose, skin condition painful (ezcema)     PATIENT STRENGTHS: Ability for insight  Average or above average intelligence  General fund of knowledge  Motivation for treatment/growth  Physical Health  Supportive family/friends    PATIENT IDENTIFIED PROBLEMS:                      DISCHARGE CRITERIA:  Improved stabilization in mood, thinking, and/or behavior Motivation to continue treatment in a less acute level of care Need for constant or close observation no longer present Reduction of life-threatening or endangering symptoms to within safe limits Verbal commitment to aftercare and medication compliance  PRELIMINARY DISCHARGE PLAN: Outpatient therapy Return to previous living arrangement  PATIENT/FAMILY INVOLVEMENT: This treatment plan has been presented to and reviewed with the patient, Jeanette Hall.  The patient and family have been given the opportunity to ask questions and make suggestions.  Judie Petit, RN 03/31/2021, 4:33 PM

## 2021-03-31 NOTE — Discharge Summary (Signed)
Physician Discharge Summary  Jeanette Hall [id number] DOB: 01-08-2001 DOA: 03/28/2021  PCP: Jeanette Hall  Admit date: 03/28/2021 Discharge date: 03/31/2021  Admitted From: Home Disposition: Inpatient psychiatric facility  Discharge Condition:Stable CODE STATUS:FULL Diet recommendation: Regular    Brief/Interim Summary: Patient is a 20 year old female with history of asthma, eczema, depression who presents to the Emergency Department following an overdose.  She drinks half of 118 mL bottle of children's Mucus Relief which contains Tylenol, dextromethorphan, guaifenesin, phenylephrine and half bottle of 237 mL of Tessalon cough syrup, took 45 tablets of 10 mg of dicyclomine.  On presentation she was hemodynamically stable.  Liver function test was unremarkable.  Tylenol level was 17.  UDS positive for opiates, cannabis.  Poison control was contacted.  She was noted to be lethargic during admission but is currently alert and oriented. Psychiatry consulted and recommended inpatient psychiatric admission.  TOC following.Patient is medically stable for discharge as soon as bed is available.  Following problems were addressed during her hospitalization:  Drug overdose/suicidal attempt/major depression: Took above medications attempting suicide.  History of severe depression. Lab works normal, unremarkable LFTs.  QTc normal. Continue one-to-one sitter.  Psych recommended inpatient psychiatric admission.Started on fluxetine Patient is medically stable for discharge.   Severe eczema: On hydrocortisone cream, Eucerin.  We recommend dermatology follow-up.   Asthma: Currently stable.  Continue bronchodilators as needed.   Hypokalemia: Supplemented with potassium today.       Discharge Diagnoses:  Principal Problem:   Intentional overdose of drug in tablet form (Jeanette Hall) Active Problems:   Eczema   Asthma   Suicide attempt (Jeanette Hall)   Major depressive disorder, single episode, severe without psychosis  (Jeanette Hall)    Discharge Instructions  Discharge Instructions     Diet general   Complete by: As directed    Discharge instructions   Complete by: As directed    1)Please follow up with psychiatry   Increase activity slowly   Complete by: As directed       Allergies as of 03/31/2021       Reactions   Fish Allergy Swelling   LIPS AND EYES        Medication List     STOP taking these medications    montelukast 5 MG chewable tablet Commonly known as: Singulair       TAKE these medications    albuterol 108 (90 Base) MCG/ACT inhaler Commonly known as: VENTOLIN HFA Inhale 2 puffs into the lungs every 6 (six) hours as needed for wheezing or shortness of breath. What changed: how much to take   FLUoxetine 10 MG capsule Commonly known as: PROZAC Take 1 capsule (10 mg total) by mouth daily.   hydrocerin Crea Apply 1 application topically 2 (two) times daily.   hydrocortisone cream 1 % Apply topically 2 (two) times daily.   LILETTA (52 MG) IU 52 mg by Intrauterine route continuous. Inserted 09/01/2018   polyethylene glycol 17 g packet Commonly known as: MIRALAX / GLYCOLAX Take 17 g by mouth daily as needed for mild constipation.   traZODone 50 MG tablet Commonly known as: DESYREL Take 0.5 tablets (25 mg total) by mouth at bedtime as needed for sleep.        Allergies  Allergen Reactions   Fish Allergy Swelling    LIPS AND EYES    Consultations: psychiatry   Procedures/Studies: Hall results found.    Subjective:  Patient seen and examined at the bedside this morning.  Hemodynamically stable.  Eating her breakfast.  Alert and oriented.  Denies any complaints, sitter at bedside   Discharge Exam: Vitals:   03/30/21 2009 03/31/21 0535  BP: (!) 105/52 119/66  Pulse: 71 61  Resp: 20 20  Temp: 98.3 F (36.8 C) 98.5 F (36.9 C)  SpO2: 100% 95%   Vitals:   03/30/21 0320 03/30/21 1533 03/30/21 2009 03/31/21 0535  BP: (!) 115/59 125/68 (!) 105/52  119/66  Pulse: (!) 57 85 71 61  Resp: '16 18 20 20  '$ Temp: 99.4 F (37.4 C) 97.8 F (36.6 C) 98.3 F (36.8 C) 98.5 F (36.9 C)  TempSrc:   Oral Oral  SpO2: 96% 100% 100% 95%  Height:        General: Pt is alert, awake, not in acute distress Cardiovascular: RRR, S1/S2 +, Hall rubs, Hall gallops Respiratory: CTA bilaterally, Hall wheezing, Hall rhonchi Abdominal: Soft, NT, ND, bowel sounds + Extremities: Hall edema, Hall cyanosis    The results of significant diagnostics from this hospitalization (including imaging, microbiology, ancillary and laboratory) are listed below for reference.     Microbiology: Recent Results (from the past 240 hour(s))  Resp Panel by RT-PCR (Flu A&B, Covid) Nasopharyngeal Swab     Status: None   Collection Time: 03/28/21  6:10 PM   Specimen: Nasopharyngeal Swab; Nasopharyngeal(NP) swabs in vial transport medium  Result Value Ref Range Status   SARS Coronavirus 2 by RT PCR NEGATIVE NEGATIVE Final    Comment: (NOTE) SARS-CoV-2 target nucleic acids are NOT DETECTED.  The SARS-CoV-2 RNA is generally detectable in upper respiratory specimens during the acute phase of infection. The lowest concentration of SARS-CoV-2 viral copies this assay can detect is 138 copies/mL. A negative result does not preclude SARS-Cov-2 infection and should not be used as the sole basis for treatment or other patient management decisions. A negative result may occur with  improper specimen collection/handling, submission of specimen other than nasopharyngeal swab, presence of viral mutation(s) within the areas targeted by this assay, and inadequate number of viral copies(<138 copies/mL). A negative result must be combined with clinical observations, patient history, and epidemiological information. The expected result is Negative.  Fact Sheet for Patients:  EntrepreneurPulse.com.au  Fact Sheet for Healthcare Providers:   IncredibleEmployment.be  This test is Hall t yet approved or cleared by the [place] FDA and  has been authorized for detection and/or diagnosis of SARS-CoV-2 by FDA under an Emergency Use Authorization (EUA). This EUA will remain  in effect (meaning this test can be used) for the duration of the COVID-19 declaration under Section 564(b)(1) of the Act, 21 U.S.C.section 360bbb-3(b)(1), unless the authorization is terminated  or revoked sooner.       Influenza A by PCR NEGATIVE NEGATIVE Final   Influenza B by PCR NEGATIVE NEGATIVE Final    Comment: (NOTE) The Xpert Xpress SARS-CoV-2/FLU/RSV plus assay is intended as an aid in the diagnosis of influenza from Nasopharyngeal swab specimens and should not be used as a sole basis for treatment. Nasal washings and aspirates are unacceptable for Xpert Xpress SARS-CoV-2/FLU/RSV testing.  Fact Sheet for Patients: EntrepreneurPulse.com.au  Fact Sheet for Healthcare Providers: IncredibleEmployment.be  This test is not yet approved or cleared by the [place] FDA and has been authorized for detection and/or diagnosis of SARS-CoV-2 by FDA under an Emergency Use Authorization (EUA). This EUA will remain in effect (meaning this test can be used) for the duration of the COVID-19 declaration under Section 564(b)(1) of the Act, 21 U.S.C. section 360bbb-3(b)(1), unless the authorization is  terminated or revoked.  Performed at [facility], [place] [address], [place], [place] [address]      Labs: BNP (last 3 results) Hall results for input(s): BNP in the last 8760 hours. Basic Metabolic Panel: Recent Labs  Lab 03/28/21 1803 03/28/21 1807 03/29/21 0650 03/30/21 0443  NA 145  --  144 142  K 3.7  --  4.1 3.4*  CL 112*  --  110 107  CO2 25  --  26 26  GLUCOSE 137*  --  90 77  BUN 10  --  8 7  CREATININE 0.83  --  0.55 0.73  CALCIUM 9.1  --  8.5* 8.7*  MG  --   1.8  --   --    Liver Function Tests: Recent Labs  Lab 03/28/21 1803 03/29/21 0650 03/30/21 0443  AST 33 26 29  ALT '26 20 24  '$ ALKPHOS 79 60 65  BILITOT 0.6 0.5 0.6  PROT 7.0 5.7* 5.8*  ALBUMIN 3.8 3.1* 3.2*   Hall results for input(s): LIPASE, AMYLASE in the last 168 hours. Hall results for input(s): AMMONIA in the last 168 hours. CBC: Recent Labs  Lab 03/28/21 1803 03/29/21 0537 03/30/21 0443  WBC 13.6* 10.3 7.6  NEUTROABS 11.7*  --   --   HGB 13.1 11.8* 11.6*  HCT 41.9 37.5 35.7*  MCV 89.5 90.8 87.1  PLT 417* 351 315   Cardiac Enzymes: Hall results for input(s): CKTOTAL, CKMB, CKMBINDEX, TROPONINI in the last 168 hours. BNP: Invalid input(s): POCBNP CBG: Recent Labs  Lab 03/28/21 1809  GLUCAP 128*   D-Dimer Hall results for input(s): DDIMER in the last 72 hours. Hgb A1c Hall results for input(s): HGBA1C in the last 72 hours. Lipid Profile Hall results for input(s): CHOL, HDL, LDLCALC, TRIG, CHOLHDL, LDLDIRECT in the last 72 hours. Thyroid function studies Hall results for input(s): TSH, T4TOTAL, T3FREE, THYROIDAB in the last 72 hours.  Invalid input(s): FREET3 Anemia work up Hall results for input(s): VITAMINB12, FOLATE, FERRITIN, TIBC, IRON, RETICCTPCT in the last 72 hours. Urinalysis    Component Value Date/Time   COLORURINE YELLOW (A) 03/28/2021 2100   APPEARANCEUR CLEAR (A) 03/28/2021 2100   LABSPEC >1.030 (H) 03/28/2021 2100   PHURINE 6.0 03/28/2021 2100   GLUCOSEU NEGATIVE 03/28/2021 2100   HGBUR NEGATIVE 03/28/2021 2100   BILIRUBINUR NEGATIVE 03/28/2021 2100   BILIRUBINUR negative 02/02/2018 1126   KETONESUR TRACE (A) 03/28/2021 2100   PROTEINUR TRACE (A) 03/28/2021 2100   UROBILINOGEN 0.2 02/02/2018 1126   UROBILINOGEN 1.0 03/28/2014 2233   NITRITE NEGATIVE 03/28/2021 2100   LEUKOCYTESUR NEGATIVE 03/28/2021 2100   Sepsis Labs Invalid input(s): PROCALCITONIN,  WBC,  LACTICIDVEN Microbiology Recent Results (from the past 240 hour(s))  Resp Panel by  RT-PCR (Flu A&B, Covid) Nasopharyngeal Swab     Status: None   Collection Time: 03/28/21  6:10 PM   Specimen: Nasopharyngeal Swab; Nasopharyngeal(NP) swabs in vial transport medium  Result Value Ref Range Status   SARS Coronavirus 2 by RT PCR NEGATIVE NEGATIVE Final    Comment: (NOTE) SARS-CoV-2 target nucleic acids are NOT DETECTED.  The SARS-CoV-2 RNA is generally detectable in upper respiratory specimens during the acute phase of infection. The lowest concentration of SARS-CoV-2 viral copies this assay can detect is 138 copies/mL. A negative result does not preclude SARS-Cov-2 infection and should not be used as the sole basis for treatment or other patient management decisions. A negative result may occur with  improper specimen collection/handling, submission of  specimen other than nasopharyngeal swab, presence of viral mutation(s) within the areas targeted by this assay, and inadequate number of viral copies(<138 copies/mL). A negative result must be combined with clinical observations, patient history, and epidemiological information. The expected result is Negative.  Fact Sheet for Patients:  EntrepreneurPulse.com.au  Fact Sheet for Healthcare Providers:  IncredibleEmployment.be  This test is Hall t yet approved or cleared by the [place] FDA and  has been authorized for detection and/or diagnosis of SARS-CoV-2 by FDA under an Emergency Use Authorization (EUA). This EUA will remain  in effect (meaning this test can be used) for the duration of the COVID-19 declaration under Section 564(b)(1) of the Act, 21 U.S.C.section 360bbb-3(b)(1), unless the authorization is terminated  or revoked sooner.       Influenza A by PCR NEGATIVE NEGATIVE Final   Influenza B by PCR NEGATIVE NEGATIVE Final    Comment: (NOTE) The Xpert Xpress SARS-CoV-2/FLU/RSV plus assay is intended as an aid in the diagnosis of influenza from Nasopharyngeal swab  specimens and should not be used as a sole basis for treatment. Nasal washings and aspirates are unacceptable for Xpert Xpress SARS-CoV-2/FLU/RSV testing.  Fact Sheet for Patients: EntrepreneurPulse.com.au  Fact Sheet for Healthcare Providers: IncredibleEmployment.be  This test is not yet approved or cleared by the [place] FDA and has been authorized for detection and/or diagnosis of SARS-CoV-2 by FDA under an Emergency Use Authorization (EUA). This EUA will remain in effect (meaning this test can be used) for the duration of the COVID-19 declaration under Section 564(b)(1) of the Act, 21 U.S.C. section 360bbb-3(b)(1), unless the authorization is terminated or revoked.  Performed at [facility], [place] [address] [place], [place] [address]     Please note: You were cared for by a hospitalist during your hospital stay. Once you are discharged, your primary care physician will handle any further medical issues. Please note that Hall REFILLS for any discharge medications will be authorized once you are discharged, as it is imperative that you return to your primary care physician (or establish a relationship with a primary care physician if you do not have one) for your post hospital discharge needs so that they can reassess your need for medications and monitor your lab values.    Time coordinating discharge: 40 minutes  SIGNED:   Shelly Coss, MD  [facility] 03/31/2021, 10:32 AM Pager [phone]  If 7PM-7AM, please contact night-coverage [email or web address] Password [email or web address]

## 2021-03-31 NOTE — Progress Notes (Signed)
Report given to Grayland Ormond at [facility].  Patient escorted to main entrance via wheelchair for transport to [facility] via [facility] transport.  Patient's belongings returned to patient for transport.

## 2021-04-01 MED ORDER — ADULT MULTIVITAMIN W/MINERALS CH
1.0000 | ORAL_TABLET | Freq: Every day | ORAL | Status: DC
  Administered 2021-04-01 – 2021-04-03 (×3): 1 via ORAL
  Filled 2021-04-01 (×5): qty 1

## 2021-04-01 MED ORDER — MAGNESIUM HYDROXIDE 400 MG/5ML PO SUSP
30.0000 mL | Freq: Every day | ORAL | Status: DC | PRN
  Administered 2021-04-02: 30 mL via ORAL
  Filled 2021-04-01: qty 30

## 2021-04-01 MED ORDER — HYDROXYZINE HCL 50 MG PO TABS: 50.0000 mg | ORAL_TABLET | Freq: Every evening | ORAL | Status: DC | PRN

## 2021-04-01 MED ORDER — MELATONIN 5 MG PO TABS
5.0000 mg | ORAL_TABLET | Freq: Every day | ORAL | Status: DC
  Administered 2021-04-01 – 2021-04-02 (×2): 5 mg via ORAL
  Filled 2021-04-01 (×5): qty 1

## 2021-04-01 MED ORDER — METRONIDAZOLE 500 MG PO TABS
500.0000 mg | ORAL_TABLET | Freq: Two times a day (BID) | ORAL | Status: DC
  Administered 2021-04-01 – 2021-04-03 (×4): 500 mg via ORAL
  Filled 2021-04-01 (×6): qty 1
  Filled 2021-04-01: qty 2
  Filled 2021-04-01 (×2): qty 1
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
## 2021-04-01 NOTE — Progress Notes (Signed)
NUTRITION ASSESSMENT RD working remotely.   Pt identified as at risk on the Malnutrition Screen Tool  INTERVENTION: - will order 1 tablet multivitamin with minerals/day. [facility] staff to continue to encourage PO intakes of meals and snacks.   NUTRITION DIAGNOSIS: Unintentional weight loss related to sub-optimal intake as evidenced by pt report.   Goal: Pt to meet >/= 90% of their estimated nutrition needs.  Monitor:  PO intake  Assessment:  Patient admitted d/t severe depression and suicide attempt by consuming half a bottle each of children's mucus relief medication and tessalon cough and chest congestion and taking 45 tablets of 10 mg dicyclomine.   No weight recorded x1 year.    20 y.o. female  Height: Ht Readings from Last 1 Encounters:  03/29/21 5\' 4"  (1.626 m) (45 %, Z= -0.12)*   * Growth percentiles are based on CDC (Girls, 2-20 Years) data.    Weight: Wt Readings from Last 1 Encounters:  03/22/20 65.8 kg (78 %, Z= 0.77)*   * Growth percentiles are based on CDC (Girls, 2-20 Years) data.    Weight Hx: Wt Readings from Last 10 Encounters:  03/22/20 65.8 kg (78 %, Z= 0.77)*  02/23/20 64.9 kg (76 %, Z= 0.71)*  02/20/20 64.7 kg (76 %, Z= 0.70)*  01/05/20 71.4 kg (88 %, Z= 1.16)*  03/21/19 71.4 kg (89 %, Z= 1.22)*  09/01/18 70.8 kg (89 %, Z= 1.22)*  02/24/18 71.9 kg (91 %, Z= 1.31)*  02/02/18 75.8 kg (93 %, Z= 1.50)*  01/26/18 78.9 kg (95 %, Z= 1.64)*  01/25/18 79.4 kg (95 %, Z= 1.66)*   * Growth percentiles are based on CDC (Girls, 2-20 Years) data.    BMI:  There is no height or weight on file to calculate BMI.  Estimated Nutritional Needs: Kcal: 25-30 kcal/kg Protein: > 1 gram protein/kg Fluid: 1 ml/kcal  Diet Order:  Diet Order             Diet regular Room service appropriate? Yes; Fluid consistency: Thin  Diet effective now                  Pt is also offered choice of unit snacks mid-morning and mid-afternoon.  Pt is eating as  desired.   Lab results and medications reviewed.       Jarome Matin, MS, RD, LDN, CNSC Inpatient Clinical Dietitian RD pager # available in [facility]  After hours/weekend pager # available in [facility]

## 2021-04-01 NOTE — Progress Notes (Signed)
The patient learned today that its okay to be "selfish" and take better care of oneself. Her goal for tomorrow is to continue to focus on herself.
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
## 2021-04-01 NOTE — Progress Notes (Signed)
EKG completed and placed on the front of the chart

## 2021-04-01 NOTE — Progress Notes (Signed)
   04/01/21 1713  Vital Signs  Temp 97.6 F (36.4 C)  Temp Source Oral  Pulse Rate (!) 57  BP 122/77  BP Method Automatic  Oxygen Therapy  SpO2 100 %   D: Patient denies SI/HI/AVH. Pt. Denies anxiety and depression. Pt. Was out in open areas and was social with peers. A:  Patient took scheduled medicine.  Support and encouragement provided Routine safety checks conducted every 15 minutes. Patient  Informed to notify staff with any concerns.   R:  Safety maintained.
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
## 2021-04-01 NOTE — Progress Notes (Signed)
D:  Jeanette Hall was up and visible on the unit.  She was noted interacting well with staff and peers.  She attended evening wrap up group with good participation.  She denied SI/HI or AVH. She rated her day as 8/10 (10 the best).  She denied any pain or discomfort and appears to be in no physical distress.  No prns required at this time.  She is currently resting quietly in bed and appears to be asleep.   A:  1:1 with RN for support and encouragement.  Medications administered as ordered.  Q 15 minute checks maintained for safety.  Encouraged participation in group and unit activities.   R:  She remains safe on the unit.  We will continue to monitor the progress towards her goals.     04/01/21 2045  Psych Admission Type (Psych Patients Only)  Admission Status Voluntary  Psychosocial Assessment  Patient Complaints None  Eye Contact Fair  Facial Expression Anxious  Affect Appropriate to circumstance  Speech Logical/coherent;Soft  Interaction Assertive  Motor Activity Other (Comment) (unremarkable)  Appearance/Hygiene In scrubs  Behavior Characteristics Cooperative;Appropriate to situation  Mood Pleasant  Thought Process  Coherency WDL  Content WDL  Delusions None reported or observed  Perception WDL  Hallucination None reported or observed  Judgment WDL  Confusion WDL  Danger to Self  Current suicidal ideation? Denies  Danger to Others  Danger to Others None reported or observed

## 2021-04-01 NOTE — Progress Notes (Signed)
Jeanette Hall was up and visible on the unit.  She attended evening wrap up group.  She stated the hydroxyzine was very helpful with her anxiety.  She questioned if she would be able to take this when she is discharged.  Encouraged her to speak with the doctor about her d/c medications.  She denied SI/HI or AVH.  She reported feeling better now that she has settled on the unit.  She feels less anxious and doesn't feel so alone being around peers that are suffering with depression.  She was able to speak with her family and they are very supportive of her.  She denied any pain or discomfort and appeared to be in no physical distress.  She requested trazodone for sleep and is currently resting with her eyes closed appearing to be asleep.  Q 15 minute checks maintained for safety.   03/31/21 2115  Psych Admission Type (Psych Patients Only)  Admission Status Voluntary  Psychosocial Assessment  Patient Complaints Anxiety  Eye Contact Fair  Facial Expression Sad  Affect Appropriate to circumstance  Speech Logical/coherent  Interaction Assertive  Motor Activity Other (Comment) (unremarkable)  Appearance/Hygiene In scrubs  Behavior Characteristics Cooperative;Appropriate to situation  Mood Depressed;Anxious  Thought Process  Coherency WDL  Content WDL  Delusions None reported or observed  Perception WDL  Hallucination None reported or observed  Judgment WDL  Confusion WDL  Danger to Self  Current suicidal ideation? Denies  Danger to Others  Danger to Others None reported or observed

## 2021-04-02 ENCOUNTER — Encounter (HOSPITAL_COMMUNITY): Payer: Self-pay

## 2021-04-02 DIAGNOSIS — K5900 Constipation, unspecified: Secondary | ICD-10-CM

## 2021-04-02 DIAGNOSIS — F419 Anxiety disorder, unspecified: Secondary | ICD-10-CM | POA: Diagnosis present

## 2021-04-02 DIAGNOSIS — F4325 Adjustment disorder with mixed disturbance of emotions and conduct: Principal | ICD-10-CM

## 2021-04-02 DIAGNOSIS — G4700 Insomnia, unspecified: Secondary | ICD-10-CM | POA: Diagnosis present

## 2021-04-02 HISTORY — DX: Constipation, unspecified: K59.00

## 2021-04-02 HISTORY — DX: Insomnia, unspecified: G47.00

## 2021-04-02 HISTORY — DX: Anxiety disorder, unspecified: F41.9

## 2021-04-02 LAB — IRON AND TIBC
Iron: 62 ug/dL (ref 28–170)
Saturation Ratios: 16 % (ref 10.4–31.8)
TIBC: 390 ug/dL (ref 250–450)
UIBC: 328 ug/dL

## 2021-04-02 LAB — LIPID PANEL
Cholesterol: 151 mg/dL (ref 0–200)
HDL: 51 mg/dL (ref >40)
LDL Cholesterol: 91 mg/dL (ref 0–99)
Total CHOL/HDL Ratio: 3 RATIO
Triglycerides: 47 mg/dL (ref <150)
VLDL: 9 mg/dL (ref 0–40)

## 2021-04-02 LAB — FERRITIN: Ferritin: 26 ng/mL (ref 11–307)

## 2021-04-02 LAB — VITAMIN B12: Vitamin B-12: 516 pg/mL (ref 180–914)

## 2021-04-02 LAB — FOLATE: Folate: 7.2 ng/mL (ref >5.9)

## 2021-04-02 LAB — VITAMIN D 25 HYDROXY (VIT D DEFICIENCY, FRACTURES): Vit D, 25-Hydroxy: 32.22 ng/mL (ref 30–100)

## 2021-04-02 MED ORDER — INFLUENZA VAC SPLIT QUAD 0.5 ML IM SUSY
0.5000 mL | PREFILLED_SYRINGE | INTRAMUSCULAR | Status: AC
  Administered 2021-04-03: 0.5 mL via INTRAMUSCULAR
  Filled 2021-04-02: qty 0.5

## 2021-04-02 MED ORDER — DOCUSATE SODIUM 100 MG PO CAPS
100.0000 mg | ORAL_CAPSULE | Freq: Every day | ORAL | Status: DC | PRN
  Administered 2021-04-02: 100 mg via ORAL
  Filled 2021-04-02: qty 1

## 2021-04-02 NOTE — Group Note (Signed)
LCSW Group Therapy Note  Group Date: 04/02/2021 Start Time: 1300 End Time: 1400   Type of Therapy and Topic:  Group Therapy: Positive Affirmations  Participation Level:  Active   Description of Group:   This group addressed positive affirmation towards self and others.  Patients went around the room and identified two positive things about themselves and two positive things about a peer in the room.  Patients reflected on how it felt to share something positive with others, to identify positive things about themselves, and to hear positive things from others/ Patients were encouraged to have a daily reflection of positive characteristics or circumstances.   Therapeutic Goals: Patients will verbalize two of their positive qualities Patients will demonstrate empathy for others by stating two positive qualities about a peer in the group Patients will verbalize their feelings when voicing positive self affirmations and when voicing positive affirmations of others Patients will discuss the potential positive impact on their wellness/recovery of focusing on positive traits of self and others.  Summary of Patient Progress:  Raquel actively engaged in the discussion and . She was able to identify positive affirmations about herself as well as other group members including that she is dependable. Patient demonstrated good insight into the subject matter, was respectful of peers, participated throughout the entire session.  Therapeutic Modalities:   Cognitive Behavioral Therapy Motivational Interviewing    Zachery Conch, LCSW 04/02/2021  3:15 PM

## 2021-04-02 NOTE — Progress Notes (Signed)
Patient affect pleasant, calm and cooperative. Denies AVH, SI or HI.   Patient reports the following on Self Inventory Questionnaire:  Sleeping good with prn sleep medication, appetite fair and energy level normal.  Rates depression 1/10 and anxiety 3/10.  Vistaril given for anxiety once this shift with relief reported.  Emotional support and encouragement provided.  Patient states their goal for today is, "Speaking to my doctors about my discharge plan." Denies pain.    Patient compliant with all scheduled meds.  Attended group.  Observed appropriate interaction with peers and staff on unit.  Reviewed POC with patient. Questions answered and understanding verbalized. Ongoing Q15 minute safety check rounds per unit protocol.   04/02/21 0800  Psych Admission Type (Psych Patients Only)  Admission Status Voluntary  Psychosocial Assessment  Patient Complaints None  Eye Contact Fair  Facial Expression Other (Comment) (pleasant)  Affect Appropriate to circumstance  Speech Logical/coherent;Soft  Interaction Assertive  Motor Activity Other (Comment) (WDL)  Appearance/Hygiene In scrubs  Behavior Characteristics Cooperative;Appropriate to situation  Mood Pleasant  Thought Process  Coherency WDL  Content WDL  Delusions None reported or observed  Perception WDL  Hallucination None reported or observed  Judgment WDL  Confusion WDL  Danger to Self  Current suicidal ideation? Denies  Danger to Others  Danger to Others None reported or observed

## 2021-04-02 NOTE — Group Note (Signed)
Recreation Therapy Group Note   Group Topic:Stress Management  Group Date: 04/02/2021 Start Time: 0930 End Time: 0945 Facilitators: Victorino Sparrow, LRT/CTRS Location: [address] Dayroom  Goal Area(s) Addresses:  Patient will actively participate in stress management techniques presented during session.  Patient will successfully identify benefit of practicing stress management post d/c.   Group Description: Progressive Muscle Relaxation. LRT provided education, instruction and demonstration on practice of Progressive Muscle Relaxation. Patient was asked to participate in technique introduced during session. Patient was to tense each muscle then release the tension. Patient was asked to pay attention to their body and if they felt any discomfort, to skip those areas during the session.  LRT informed pts about resources to access pre-recorded scripts for PMR post d/c via Youtube and other apps or via internet with a smartphone, tablet, and/or computer.   Affect/Mood: N/A   Participation Level: Did not attend    Clinical Observations/Individualized Feedback: Pt did not attend group session.   Plan: Continue to engage patient in RT group sessions 2-3x/week.   Victorino Sparrow, LRT/CTRS 04/02/2021 12:18 PM

## 2021-04-02 NOTE — Progress Notes (Signed)
[facility] MD Progress Note  04/02/2021 5:58 PM Jeanette Hall  MRN:  [id number] Subjective:  Jeanette Hall is a 20 year old female with no formal psychiatric history and a medical history of asthma and severe eczema who presents voluntarily after an overdose attempt on half of a 118 mL bottle of children's Mucus Relief (contains acetaminophen, dextromethorphan, guaifenesin, phenylephrine), half a bottle of 237 mL of Tessalon cough and chest congestion (dextromethorphan and guaifenesin) and swallowed approximately 45 tablets of 10 mg of dicyclomine.  Case was discussed in the multidisciplinary team. MAR was reviewed and patient was compliant with medications.  She reports that she is doing well today, slept well last night, and appetite is good.  She reports no SI, HI, AVH, first rank symptoms, ideas of reference, or paranoia on the unit.  She feels that she is doing better overall. She has been in contact with family on the phone. She complains of being constipated.    Principal Problem: Adjustment disorder with mixed disturbance of emotions and conduct Diagnosis: Principal Problem:   Adjustment disorder with mixed disturbance of emotions and conduct Active Problems:   Intentional overdose of drug in tablet form (HCC)   Eczema   Anxiety disorder, unspecified   Constipation   Insomnia  Total Time spent with patient: I personally spent 35 minutes on the unit in direct patient care. The direct patient care time included face-to-face time with the patient, reviewing the patient's chart, communicating with other professionals, and coordinating care. Greater than 50% of this time was spent in counseling or coordinating care with the patient regarding goals of hospitalization, psycho-education, and discharge planning needs.   Past Psychiatric History: Endorses ongoing depression for the last year and a half, no formal diagnoses, no prior inpatient admissions, no prior outpatient follow-up  Past Medical History:   Past Medical History:  Diagnosis Date   Angio-edema    Asthma    Eczema    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    Past Surgical History:  Procedure Laterality Date   MASS EXCISION N/A 05/05/2016   Procedure: EXCISION  OF TONGUE MASS;  Surgeon: Leta Baptist, MD;  Location: [facility];  Service: ENT;  Laterality: N/A;  EXCISION  OF TONGUE MASS   Family History:  Family History  Problem Relation Age of Onset   Asthma Maternal Uncle    Hypertension Maternal Grandmother    Asthma Maternal Grandmother    Cancer Maternal Grandmother    Diabetes Maternal Grandmother    Cancer Paternal Grandmother    Hypertension Mother    Family Psychiatric  History: Brother-schizophrenia spectrum Social History:  Has a 52-year-old son Currently in between home, lives with mother or friend Recently lost her job Recent arrest in August due to "defending myself against my son's dad." Smokes marijuana 2-3 times per week Reports use of 1/2 Black n Mild daily Social History   Substance and Sexual Activity  Alcohol Use No     Social History   Substance and Sexual Activity  Drug Use Not Currently    Social History   Socioeconomic History   Marital status: Single    Spouse name: Not on file   Number of children: Not on file   Years of education: Not on file   Highest education level: Not on file  Occupational History   Not on file  Tobacco Use   Smoking status: Never   Smokeless tobacco: Never   Tobacco comments:    stepfather smokes outside  [place]  Use: Never used  Substance and Sexual Activity   Alcohol use: No   Drug use: Not Currently   Sexual activity: Yes    Partners: Male    Birth control/protection: None, I.U.D.  Other Topics Concern   Not on file  Social History Narrative   Not on file   Social Determinants of Health   Financial Resource Strain: Not on file  Food Insecurity: Not on file  Transportation Needs: Not on file  Physical Activity: Not on file   Stress: Not on file  Social Connections: Not on file   Additional Social History:      Sleep: Fair  Appetite:  Fair  Current Medications: Current Facility-Administered Medications  Medication Dose Route Frequency Provider Last Rate Last Admin   albuterol (PROVENTIL) (2.5 MG/3ML) 0.083% nebulizer solution 2.5 mg  2.5 mg Nebulization Q6H PRN Starkes-Perry, Gayland Curry, FNP       alum & mag hydroxide-simeth (MAALOX/MYLANTA) 200-200-20 MG/5ML suspension 30 mL  30 mL Oral Q4H PRN Starkes-Perry, Gayland Curry, FNP       dicyclomine (BENTYL) tablet 20 mg  20 mg Oral TID PRN Suella Broad, FNP       FLUoxetine (PROZAC) capsule 10 mg  10 mg Oral Daily Suella Broad, FNP   10 mg at 04/02/21 6160   hydrocerin (EUCERIN) cream   Topical BID Suella Broad, FNP   Given at 04/01/21 1702   hydrocortisone cream 1 % 1 application  1 application Topical BID Suella Broad, FNP   1 application at 73/71/06 2694   hydrOXYzine (ATARAX/VISTARIL) tablet 25 mg  25 mg Oral TID PRN Rosezetta Schlatter, MD   25 mg at 04/02/21 1421   hydrOXYzine (ATARAX/VISTARIL) tablet 50 mg  50 mg Oral QHS PRN Rosezetta Schlatter, MD       ibuprofen (ADVIL) tablet 400 mg  400 mg Oral Q6H PRN Suella Broad, FNP       [START ON 04/03/2021] influenza vac split quadrivalent PF (FLUARIX) injection 0.5 mL  0.5 mL Intramuscular Tomorrow-1000 Lovena Le, Cody W, PA-C       magnesium hydroxide (MILK OF MAGNESIA) suspension 30 mL  30 mL Oral Daily PRN Margorie John W, PA-C   30 mL at 04/02/21 0616   melatonin tablet 5 mg  5 mg Oral QHS Rosezetta Schlatter, MD   5 mg at 04/01/21 2119   metroNIDAZOLE (FLAGYL) tablet 500 mg  500 mg Oral Q12H Rosezetta Schlatter, MD   500 mg at 04/02/21 8546   multivitamin with minerals tablet 1 tablet  1 tablet Oral Daily Harlow Asa, MD   1 tablet at 04/02/21 0835   traZODone (DESYREL) tablet 25 mg  25 mg Oral QHS PRN Suella Broad, FNP   25 mg at 03/31/21 2134    Lab Results:   Results for orders placed or performed during the hospital encounter of 03/31/21 (from the past 48 hour(s))  Lipid panel     Status: None   Collection Time: 04/02/21  6:30 AM  Result Value Ref Range   Cholesterol 151 0 - 200 mg/dL   Triglycerides 47 <150 mg/dL   HDL 51 >40 mg/dL   Total CHOL/HDL Ratio 3.0 RATIO   VLDL 9 0 - 40 mg/dL   LDL Cholesterol 91 0 - 99 mg/dL    Comment:        Total Cholesterol/HDL:CHD Risk Coronary Heart Disease Risk Table  Men   Women  1/2 Average Risk   3.4   3.3  Average Risk       5.0   4.4  2 X Average Risk   9.6   7.1  3 X Average Risk  23.4   11.0        Use the calculated Patient Ratio above and the CHD Risk Table to determine the patient's CHD Risk.        ATP III CLASSIFICATION (LDL):  <100     mg/dL   Optimal  100-129  mg/dL   Near or Above                    Optimal  130-159  mg/dL   Borderline  160-189  mg/dL   High  >190     mg/dL   Very High Performed at [facility] [address], [place], [place] [address]   Iron and TIBC     Status: None   Collection Time: 04/02/21  6:30 AM  Result Value Ref Range   Iron 62 28 - 170 ug/dL   TIBC 390 250 - 450 ug/dL   Saturation Ratios 16 10.4 - 31.8 %   UIBC 328 ug/dL    Comment: Performed at [facility], [place] [address], [place], [place] [address]  Ferritin     Status: None   Collection Time: 04/02/21  6:30 AM  Result Value Ref Range   Ferritin 26 11 - 307 ng/mL    Comment: Performed at [facility], [place] [address], [place], [place] [address]  Folate, serum, performed at [facility] lab     Status: None   Collection Time: 04/02/21  6:30 AM  Result Value Ref Range   Folate 7.2 >5.9 ng/mL    Comment: Performed at [facility], [place] [address] [place], [place] [address]  Vitamin B12     Status: None   Collection Time: 04/02/21  6:30 AM  Result Value Ref Range   Vitamin B-12 516 180 - 914  pg/mL    Comment: (NOTE) This assay is not validated for testing neonatal or myeloproliferative syndrome specimens for Vitamin B12 levels. Performed at [facility], [place] [address], [place], [place] [address]   VITAMIN D 25 Hydroxy (Vit-D Deficiency, Fractures)     Status: None   Collection Time: 04/02/21  6:30 AM  Result Value Ref Range   Vit D, 25-Hydroxy 32.22 30 - 100 ng/mL    Comment: (NOTE) Vitamin D deficiency has been defined by the [place] practice guideline as a level of serum 25-OH  vitamin D less than 20 ng/mL (1,2). The Endocrine Society went on to  further define vitamin D insufficiency as a level between 21 and 29  ng/mL (2).  1. IOM (Institute of Medicine). 2010. Dietary reference intakes for  calcium and D. [place]: The [employer]. 2. Holick MF, Binkley [place], Bischoff-Ferrari HA, et al. Evaluation,  treatment, and prevention of vitamin D deficiency: an Endocrine  Society clinical practice guideline, JCEM. 2011 Jul; 96(7): 1911-30.  Performed at [facility], [place] [address], [place], [place] [address]     Blood Alcohol level:  Lab Results  Component Value Date   [facility] <10 73/53/2992    Metabolic Disorder Labs: Lab Results  Component Value Date   HGBA1C 5.1 08/19/2017   No results found for: PROLACTIN Lab Results  Component Value Date   CHOL 151 04/02/2021   TRIG 47 04/02/2021   HDL 51 04/02/2021   CHOLHDL 3.0 04/02/2021   VLDL 9 04/02/2021   LDLCALC 91 04/02/2021    Physical Findings:  Musculoskeletal: Strength & Muscle Tone: within normal limits Gait & Station: normal, steady Patient leans: N/A  Psychiatric Specialty Exam:  Presentation  General Appearance: Appropriate for Environment; Casual  Eye Contact:Good  Speech:Clear and Coherent; Normal Rate  Speech Volume:Normal  Handedness:Right   Mood and Affect  Mood:Euthymic (I feel really good  today)  Affect:Appropriate; Congruent   Thought Process  Thought Processes:Coherent; Linear  Descriptions of Associations:Intact  Orientation:Full (Time, Place and Person)  Thought Content:Logical  History of Schizophrenia/Schizoaffective disorder:No data recorded Duration of Psychotic Symptoms:No data recorded Hallucinations:Hallucinations: None  Ideas of Reference:None  Suicidal Thoughts:Suicidal Thoughts: No  Homicidal Thoughts:Homicidal Thoughts: No   Sensorium  Memory:Immediate Good; Recent Good; Remote Good  Judgment:Good  Insight:Good   Executive Functions  Concentration:Good  Attention Span:Good  [place] of Knowledge:Good  Language:Good   Psychomotor Activity  Psychomotor Activity:Psychomotor Activity: Normal   Assets  Assets:Communication Skills; Desire for Improvement; Resilience; Social Support   Sleep  Sleep:Sleep: Good Number of Hours of Sleep: 6.75    Physical Exam: Physical Exam Vitals and nursing note reviewed.  HENT:     Head: Normocephalic and atraumatic.     Nose: Nose normal.  Eyes:     Extraocular Movements: Extraocular movements intact.  Cardiovascular:     Rate and Rhythm: Normal rate.  Pulmonary:     Effort: Pulmonary effort is normal.  Skin:    General: Skin is warm and dry.  Neurological:     General: No focal deficit present.     Mental Status: She is alert and oriented to person, place, and time.  Psychiatric:        Attention and Perception: Attention normal.        Mood and Affect: Mood is anxious.        Speech: Speech normal.        Behavior: Behavior normal.        Thought Content: Thought content normal.        Cognition and Memory: Cognition normal.        Judgment: Judgment normal.   ROS Blood pressure 116/81, pulse 70, temperature 98.1 F (36.7 C), temperature source Oral, resp. rate 18, height 5\' 4"  (1.626 m), weight 62.8 kg, last menstrual period 03/05/2021, SpO2 100 %. Body mass  index is 23.77 kg/m.   Treatment Plan Summary: Daily contact with patient to assess and evaluate symptoms and progress in treatment and Medication management  Mood: Appears to have adjustment disorder v. Evolving PTSD V. Depression.  - encourage attendance to group and milieu therapy. - Prozac 10mg  PO daily - hydroxyzine 50mg  PO QHS PRN insomnia  Anxiety: - Hydroxyzine 25mg  TID PRN   Constipation: -Milk of magnesia available for this PRN. Like secondary to poor PO intake and anxiety  Maida Sale, MD 04/02/2021, 5:58 PM

## 2021-04-02 NOTE — Progress Notes (Signed)
Adult Psychoeducational Group Note  Date:  04/02/2021 Time:  9:10 AM  Group Topic/Focus:  Goals Group:   The focus of this group is to help patients establish daily goals to achieve during treatment and discuss how the patient can incorporate goal setting into their daily lives to aide in recovery.  Participation Level:  Active  Participation Quality:  Appropriate  Affect:  Appropriate  Cognitive:  Appropriate  Insight: Appropriate  Engagement in Group:  Engaged  Modes of Intervention:  Discussion  Additional Comment The  patient expressed that her goals is to speak with Doctor about discharge.  Jeanette Hall 04/02/2021, 9:10 AM

## 2021-04-02 NOTE — Progress Notes (Signed)
   04/02/21 2122  Psych Admission Type (Psych Patients Only)  Admission Status Voluntary  Psychosocial Assessment  Patient Complaints None  Eye Contact Fair  Facial Expression Animated  Affect Appropriate to circumstance  Speech Logical/coherent;Soft  Interaction Assertive  Motor Activity Other (Comment) (WDL)  Appearance/Hygiene In scrubs  Behavior Characteristics Cooperative;Appropriate to situation  Mood Pleasant  Thought Process  Coherency WDL  Content WDL  Delusions None reported or observed  Perception WDL  Hallucination None reported or observed  Judgment WDL  Confusion None  Danger to Self  Current suicidal ideation? Denies  Danger to Others  Danger to Others None reported or observed

## 2021-04-02 NOTE — BH IP Treatment Plan (Signed)
Interdisciplinary Treatment and Diagnostic Plan Update  04/02/2021 Time of Session: 9:10am  Jeanette Hall MRN: [id number]  Principal Diagnosis: MDD (major depressive disorder), recurrent episode, severe ([place])  Secondary Diagnoses: Principal Problem:   MDD (major depressive disorder), recurrent episode, severe ([place]) Active Problems:   Intentional overdose of drug in tablet form ([place])   Eczema   Current Medications:  Current Facility-Administered Medications  Medication Dose Route Frequency Provider Last Rate Last Admin   albuterol (PROVENTIL) (2.5 MG/3ML) 0.083% nebulizer solution 2.5 mg  2.5 mg Nebulization Q6H PRN Starkes-Perry, Gayland Curry, FNP       alum & mag hydroxide-simeth (MAALOX/MYLANTA) 200-200-20 MG/5ML suspension 30 mL  30 mL Oral Q4H PRN Starkes-Perry, Gayland Curry, FNP       dicyclomine (BENTYL) tablet 20 mg  20 mg Oral TID PRN Suella Broad, FNP       FLUoxetine (PROZAC) capsule 10 mg  10 mg Oral Daily Suella Broad, FNP   10 mg at 04/02/21 5974   hydrocerin (EUCERIN) cream   Topical BID Suella Broad, FNP   Given at 04/01/21 1702   hydrocortisone cream 1 % 1 application  1 application Topical BID Suella Broad, FNP   1 application at 16/38/45 3646   hydrOXYzine (ATARAX/VISTARIL) tablet 25 mg  25 mg Oral TID PRN Rosezetta Schlatter, MD   25 mg at 04/02/21 8032   hydrOXYzine (ATARAX/VISTARIL) tablet 50 mg  50 mg Oral QHS PRN Rosezetta Schlatter, MD       ibuprofen (ADVIL) tablet 400 mg  400 mg Oral Q6H PRN Suella Broad, FNP       [START ON 04/03/2021] influenza vac split quadrivalent PF (FLUARIX) injection 0.5 mL  0.5 mL Intramuscular Tomorrow-1000 Lovena Le, Cody W, PA-C       magnesium hydroxide (MILK OF MAGNESIA) suspension 30 mL  30 mL Oral Daily PRN Margorie John W, PA-C   30 mL at 04/02/21 0616   melatonin tablet 5 mg  5 mg Oral QHS Rosezetta Schlatter, MD   5 mg at 04/01/21 2119   metroNIDAZOLE (FLAGYL) tablet 500 mg  500 mg Oral Q12H Rosezetta Schlatter, MD   500 mg at 04/02/21 1224   multivitamin with minerals tablet 1 tablet  1 tablet Oral Daily Harlow Asa, MD   1 tablet at 04/02/21 0835   traZODone (DESYREL) tablet 25 mg  25 mg Oral QHS PRN Suella Broad, FNP   25 mg at 03/31/21 2134   PTA Medications: Medications Prior to Admission  Medication Sig Dispense Refill Last Dose   albuterol (VENTOLIN HFA) 108 (90 Base) MCG/ACT inhaler Inhale 2 puffs into the lungs every 6 (six) hours as needed for wheezing or shortness of breath. (Patient taking differently: Inhale 2-3 puffs into the lungs every 6 (six) hours as needed for wheezing or shortness of breath.) 18 g 0    FLUoxetine (PROZAC) 10 MG capsule Take 1 capsule (10 mg total) by mouth daily.  3    hydrocerin (EUCERIN) CREA Apply 1 application topically 2 (two) times daily.  0    hydrocortisone cream 1 % Apply topically 2 (two) times daily. 30 g 0    Levonorgestrel (LILETTA, 52 MG, IU) 52 mg by Intrauterine route continuous. Inserted 09/01/2018      polyethylene glycol (MIRALAX / GLYCOLAX) 17 g packet Take 17 g by mouth daily as needed for mild constipation. 14 each 0    traZODone (DESYREL) 50 MG tablet Take 0.5 tablets (25 mg total) by  mouth at bedtime as needed for sleep.       Patient Stressors: Other: feels she has no purpose, skin condition painful (ezcema)    Patient Strengths: Ability for insight  Average or above average intelligence  General fund of knowledge  Motivation for treatment/growth  Physical Health  Supportive family/friends   Treatment Modalities: Medication Management, Group therapy, Case management,  1 to 1 session with clinician, Psychoeducation, Recreational therapy.   Physician Treatment Plan for Primary Diagnosis: MDD (major depressive disorder), recurrent episode, severe ([place]) Long Term Goal(s): Improvement in symptoms so as ready for discharge   Short Term Goals: Ability to identify changes in lifestyle to reduce recurrence of  condition will improve Ability to verbalize feelings will improve Ability to disclose and discuss suicidal ideas Ability to demonstrate self-control will improve Ability to identify and develop effective coping behaviors will improve Compliance with prescribed medications will improve  Medication Management: Evaluate patient's response, side effects, and tolerance of medication regimen.  Therapeutic Interventions: 1 to 1 sessions, Unit Group sessions and Medication administration.  Evaluation of Outcomes: Not Met  Physician Treatment Plan for Secondary Diagnosis: Principal Problem:   MDD (major depressive disorder), recurrent episode, severe ([place]) Active Problems:   Intentional overdose of drug in tablet form ([place])   Eczema  Long Term Goal(s): Improvement in symptoms so as ready for discharge   Short Term Goals: Ability to identify changes in lifestyle to reduce recurrence of condition will improve Ability to verbalize feelings will improve Ability to disclose and discuss suicidal ideas Ability to demonstrate self-control will improve Ability to identify and develop effective coping behaviors will improve Compliance with prescribed medications will improve     Medication Management: Evaluate patient's response, side effects, and tolerance of medication regimen.  Therapeutic Interventions: 1 to 1 sessions, Unit Group sessions and Medication administration.  Evaluation of Outcomes: Not Met   RN Treatment Plan for Primary Diagnosis: MDD (major depressive disorder), recurrent episode, severe ([place]) Long Term Goal(s): Knowledge of disease and therapeutic regimen to maintain health will improve  Short Term Goals: Ability to remain free from injury will improve, Ability to participate in decision making will improve, Ability to verbalize feelings will improve, Ability to disclose and discuss suicidal ideas, and Ability to identify and develop effective coping behaviors will  improve  Medication Management: RN will administer medications as ordered by provider, will assess and evaluate patient's response and provide education to patient for prescribed medication. RN will report any adverse and/or side effects to prescribing provider.  Therapeutic Interventions: 1 on 1 counseling sessions, Psychoeducation, Medication administration, Evaluate responses to treatment, Monitor vital signs and CBGs as ordered, Perform/monitor CIWA, COWS, AIMS and Fall Risk screenings as ordered, Perform wound care treatments as ordered.  Evaluation of Outcomes: Not Met   LCSW Treatment Plan for Primary Diagnosis: MDD (major depressive disorder), recurrent episode, severe ([place]) Long Term Goal(s): Safe transition to appropriate next level of care at discharge, Engage patient in therapeutic group addressing interpersonal concerns.  Short Term Goals: Engage patient in aftercare planning with referrals and resources, Increase social support, Increase emotional regulation, Facilitate acceptance of mental health diagnosis and concerns, Identify triggers associated with mental health/substance abuse issues, and Increase skills for wellness and recovery  Therapeutic Interventions: Assess for all discharge needs, 1 to 1 time with Social worker, Explore available resources and support systems, Assess for adequacy in community support network, Educate family and significant other(s) on suicide prevention, Complete Psychosocial Assessment, Interpersonal group therapy.  Evaluation of  Outcomes: Not Met   Progress in Treatment: Attending groups: Yes. Participating in groups: Yes. Taking medication as prescribed: Yes. Toleration medication: Yes. Family/Significant other contact made: Yes, individual(s) contacted:  Mother Patient understands diagnosis: Yes. and No. Discussing patient identified problems/goals with staff: Yes. Medical problems stabilized or resolved: Yes. Denies suicidal/homicidal  ideation: Yes. Issues/concerns per patient self-inventory: No.   New problem(s) identified: No, Describe:  None   New Short Term/Long Term Goal(s): medication stabilization, elimination of SI thoughts, development of comprehensive mental wellness plan.   Patient Goals:  Did not attend   Discharge Plan or Barriers: Patient recently admitted. CSW will continue to follow and assess for appropriate referrals and possible discharge planning.   Reason for Continuation of Hospitalization: Anxiety Depression Medication stabilization Suicidal ideation  Estimated Length of Stay: 3 to 5 days    Scribe for Treatment Team: Darleen Crocker, Latanya Presser 04/02/2021 2:03 PM

## 2021-04-03 ENCOUNTER — Other Ambulatory Visit (HOSPITAL_COMMUNITY): Payer: Self-pay

## 2021-04-03 LAB — HEMOGLOBIN A1C
Hgb A1c MFr Bld: 5.3 % (ref 4.8–5.6)
Mean Plasma Glucose: 105 mg/dL

## 2021-04-03 MED ORDER — DOCUSATE SODIUM 100 MG PO CAPS
100.0000 mg | ORAL_CAPSULE | Freq: Every day | ORAL | 0 refills | Status: DC | PRN
  Filled 2021-04-03: qty 10, 10d supply, fill #0

## 2021-04-03 MED ORDER — FLUOXETINE HCL 10 MG PO CAPS
ORAL_CAPSULE | ORAL | 0 refills | Status: DC
  Filled 2021-04-03: qty 30, 30d supply, fill #0

## 2021-04-03 MED ORDER — HYDROXYZINE HCL 25 MG PO TABS
25.0000 mg | ORAL_TABLET | Freq: Three times a day (TID) | ORAL | 0 refills | Status: DC | PRN
  Filled 2021-04-03: qty 30, 10d supply, fill #0

## 2021-04-03 MED ORDER — MELATONIN 5 MG PO TABS
5.0000 mg | ORAL_TABLET | Freq: Every day | ORAL | 0 refills | Status: DC
  Filled 2021-04-03: qty 30, 30d supply, fill #0

## 2021-04-03 MED ORDER — METRONIDAZOLE 500 MG PO TABS
500.0000 mg | ORAL_TABLET | Freq: Two times a day (BID) | ORAL | 0 refills | Status: AC
  Filled 2021-04-03: qty 10, 5d supply, fill #0

## 2021-04-03 NOTE — Progress Notes (Signed)
  [facility] Adult Case Management Discharge Plan :  Will you be returning to the same living situation after discharge:  Yes,  Home  At discharge, do you have transportation home?: Yes,  Sister  Do you have the ability to pay for your medications: Yes,  Medicaid   Release of information consent forms completed and in the chart;  Patient's signature needed at discharge.  Patient to Follow up at:  [facility]. Go to.   Specialty: Behavioral Health Why: Please go to this provider for therapy and medication management services during walk in hours:  Monday through Wednesday, from 7:45 am to 11:00 am.  Services are provided on a first come, first served basis - please arrive early. Contact information: [place] [phone]                Next level of care provider has access to [place] and Suicide Prevention discussed: Yes,  with mother      Has patient been referred to the Quitline?: N/A patient is not a smoker  Patient has been referred for addiction treatment: N/A  Darleen Crocker, [place] 04/03/2021, 11:00 AM

## 2021-04-03 NOTE — Progress Notes (Signed)
   04/03/21 5500  Vital Signs  Temp (!) 97.3 F (36.3 C)  Temp Source Oral  Pulse Rate (!) 59  BP 128/76  BP Location Right Arm  BP Method Automatic  Patient Position (if appropriate) Sitting   D: Patient denies SI/HI/AVH. Patient reported anxiety 7/10 and denied depression. Pt.was out in open areas and was social with peers. Pt. Reported that she was happy and excited to go home.  A:  Patient took scheduled medicine.  Support and encouragement provided Routine safety checks conducted every 15 minutes. Patient  Informed to notify staff with any concerns.   R:  Safety maintained.

## 2021-04-03 NOTE — BHH Suicide Risk Assessment (Signed)
Jeanette Hall INPATIENT:  Family/Significant Other Suicide Prevention Education  Suicide Prevention Education:  Education Completed; Jeanette Hall [phone] (Mother) has been identified by the patient as the family member/significant other with whom the patient will be residing, and identified as the person(s) who will aid the patient in the event of a mental health crisis (suicidal ideations/suicide attempt).  With written consent from the patient, the family member/significant other has been provided the following suicide prevention education, prior to the and/or following the discharge of the patient.  The suicide prevention education provided includes the following: Suicide risk factors Suicide prevention and interventions National Suicide Hotline telephone number [facility] assessment telephone number [facility] Emergency Assistance [place] and/or Residential Mobile Crisis Unit telephone number  Request made of family/significant other to: Remove weapons (e.g., guns, rifles, knives), all items previously/currently identified as safety concern.   Remove drugs/medications (over-the-counter, prescriptions, illicit drugs), all items previously/currently identified as a safety concern.  The family member/significant other verbalizes understanding of the suicide prevention education information provided.  The family member/significant other agrees to remove the items of safety concern listed above.  CSW spoke with Mrs. Battiste who states that her daughter will be living with her after discharge and that she has no concerns about her mental health at this time. Mrs. Battiste states that there are no weapons or firearms in the home and that she feels that her daughter is doing better since coming to the hospital and has expressed to her that she has learned some coping skills since being in the hospital.  CSW completed SPE with Mrs. Battiste.  Jeanette Hall  04/03/2021,  11:06 AM
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
## 2021-04-03 NOTE — Progress Notes (Signed)
Adult Psychoeducational Group Note  Date:  04/03/2021 Time:  9:03 AM  Group Topic/Focus:  Goals Group:   The focus of this group is to help patients establish daily goals to achieve during treatment and discuss how the patient can incorporate goal setting into their daily lives to aide in recovery.  Participation Level:  Active  Participation Quality:  Appropriate  Affect:  Appropriate  Cognitive:  Appropriate  Insight: Appropriate  Engagement in Group:  Engaged  Modes of Intervention:  Discussion  Additional Comments:  The patient goal is to be patient for discharge.  Nash Shearer 04/03/2021, 9:03 AM
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
## 2021-04-03 NOTE — BHH Suicide Risk Assessment (Signed)
Discharge Note:  Patient denies SI/HI AVH at this time. Discharge instructions, AVS, prescriptions and transition record gone over with patient. Patient agrees to comply with medication management, follow-up visit, and outpatient therapy. Patient belongings returned to patient. Patient questions and concerns addressed and answered.  Patient ambulatory off unit.  Patient discharged to home with friend.   
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
## 2021-04-07 ENCOUNTER — Encounter (HOSPITAL_COMMUNITY): Payer: Self-pay

## 2021-04-07 ENCOUNTER — Other Ambulatory Visit: Payer: Self-pay

## 2021-04-07 ENCOUNTER — Ambulatory Visit (HOSPITAL_COMMUNITY)
Admission: EM | Admit: 2021-04-07 | Discharge: 2021-04-07 | Disposition: A | Discharge status: Home / Self Care | Payer: Medicaid Other | Attending: Emergency Medicine | Admitting: Emergency Medicine

## 2021-04-07 DIAGNOSIS — L309 Dermatitis, unspecified: Secondary | ICD-10-CM | POA: Diagnosis not present

## 2021-04-07 MED ORDER — METHYLPREDNISOLONE 4 MG PO TABS: ORAL_TABLET | ORAL | 0 refills | Status: AC

## 2021-04-07 NOTE — ED Triage Notes (Signed)
Pt presents with eczema flare up all over body X 1 week with no relief with topical medication.

## 2021-04-07 NOTE — Discharge Instructions (Addendum)
Call [facility] tomorrow to get an urgent appointment for severe, whole body eczema.   Take the methylprednisolone as prescribed.  You will take a full dose (8 pills a day) for 2 weeks and then slowly taper off of it over another month.   Continue using Eucerin on your skin. Pay attention to any open areas and seek medical attention if you think the open areas might be getting infected.   You can take hydroxyzine 25-50mg  three times a day for itching. You are currently prescribed to take only 25mg  three times a day.

## 2021-04-08 NOTE — ED Provider Notes (Signed)
Jeanette Hall    CSN: [id number] Arrival date & time: 04/07/21  1827      History   Chief Complaint Chief Complaint  Patient presents with   Eczema Flare Up     HPI Jeanette Hall is a 20 y.o. female.  Patient with history severe eczema.  Has not had a significant flare since she was a child.  When she was a child, she had "head to toe" eczema and required treatment from several specialists including dermatology.  About a week ago patient experienced significant similar eczema flare with eczema all over her body from her head/scalp and face torso, back, bilateral upper extremities, bilateral lower extremities.  Discomfort was severe and apparently patient attempted suicide as a result.  She spent several days in behavioral health.  During that admission, she used hydrocortisone cream and Eucerin cream to her eczema for no relief.  Patient no longer has a dermatologist.  She is requesting something to help relieve the discomfort from eczema.  Former patient of [facility] dermatology but has not been seen there in years.  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   Intentional overdose of drug in tablet form ([place]) 03/28/2021   Eczema 03/28/2021   Asthma 03/28/2021   Encounter for insertion of copper IUD 09/01/2018   Encounter for removal and reinsertion of IUD 09/01/2018   IUD strings lost 02/24/2018   IUD (intrauterine device) in place 01/26/2018   Vitamin D deficiency 08/27/2017   Hx of tonsillectomy 08/19/2017    Past Surgical History:  Procedure Laterality Date   MASS EXCISION N/A 05/05/2016    Procedure: EXCISION  OF TONGUE MASS;  Surgeon: Leta Baptist, MD;  Location: [place];  Service: ENT;  Laterality: N/A;  EXCISION  OF TONGUE MASS    OB History     Gravida  1   Para  1   Term  1   Preterm      AB      Living  1      SAB      IAB      Ectopic      Multiple  0   Live Births  1            Home Medications    Prior to Admission medications   Medication Sig Start Date End Date Taking? Authorizing Provider  methylPREDNISolone (MEDROL) 4 MG tablet Take 8 tablets (32 mg total) by mouth daily for 14 days, THEN 7 tablets (28 mg total) daily for 4 days, THEN 6 tablets (24 mg total) daily for 4 days, THEN 5 tablets (20 mg total) daily for 4 days, THEN 4 tablets (16 mg total) daily for 4 days, THEN 3 tablets (12 mg total) daily for 4 days, THEN 2 tablets (8 mg total) daily for 4 days, THEN 1 tablet (4 mg total) daily for 4 days. 04/07/21 05/19/21 Yes Carvel Getting, NP  albuterol (VENTOLIN HFA) 108 (90 Base) MCG/ACT inhaler Inhale 2 puffs into the lungs every 6 (six) hours as needed for wheezing or shortness of breath. Patient taking  differently: Inhale 2-3 puffs into the lungs every 6 (six) hours as needed for wheezing or shortness of breath. 05/30/19   Jaynee Eagles, PA-C  docusate sodium (COLACE) 100 MG capsule Take 1 capsule (100 mg total) by mouth daily as needed for moderate constipation. 04/03/21   Maida Sale, MD  FLUoxetine (PROZAC) 10 MG capsule Take 1 capsule (10 mg total) by mouth daily. 03/31/21   Shelly Coss, MD  FLUoxetine (PROZAC) 10 MG capsule Take 1 capsule by mouth daily 04/03/21   Maida Sale, MD  hydrocerin (EUCERIN) CREA Apply 1 application topically 2 (two) times daily. 03/31/21   Shelly Coss, MD  hydrocortisone cream 1 % Apply topically 2 (two) times daily. 03/31/21   Shelly Coss, MD  hydrOXYzine (ATARAX/VISTARIL) 25 MG tablet Take 1 tablet (25 mg total) by mouth 3 (three) times daily as needed for anxiety.  04/03/21   Maida Sale, MD  Levonorgestrel (LILETTA, 52 MG, IU) 52 mg by Intrauterine route continuous. Inserted 09/01/2018 09/01/18   [provider]  melatonin 5 MG TABS Take 1 tablet (5 mg total) by mouth at bedtime. 04/03/21   Maida Sale, MD  metroNIDAZOLE (FLAGYL) 500 MG tablet Take 1 tablet (500 mg total) by mouth every 12 (twelve) hours for 5 days. 04/03/21 04/08/21  Maida Sale, MD  cetirizine (ZYRTEC) 10 MG tablet Take 1 tablet (10 mg total) by mouth daily. Patient not taking: Reported on 03/21/2019 12/31/17 05/30/19  Kennith Gain, MD  fluticasone [facility]) 50 MCG/ACT nasal spray Place 2 sprays into both nostrils daily. Patient not taking: Reported on 03/21/2019 11/11/17 05/30/19  Morene Crocker, CNM    Family History Family History  Problem Relation Age of Onset   Asthma Maternal Uncle    Hypertension Maternal Grandmother    Asthma Maternal Grandmother    Cancer Maternal Grandmother    Diabetes Maternal Grandmother    Cancer Paternal Grandmother    Hypertension Mother     Social History Social History   Tobacco Use   Smoking status: Never   Smokeless tobacco: Never   Tobacco comments:    stepfather smokes outside  Vaping Use   Vaping Use: Never used  Substance Use Topics   Alcohol use: No   Drug use: Not Currently     Allergies   Fish allergy   Review of Systems Review of Systems  Constitutional:  Negative for fever.  Respiratory:  Negative for shortness of breath and wheezing.   Skin:  Positive for rash.    Physical Exam Triage Vital Signs ED Triage Vitals  Enc Vitals Group     BP --      Pulse Rate 04/07/21 1933 (!) 125     Resp 04/07/21 1933 20     Temp 04/07/21 1933 98.5 F (36.9 C)     Temp Source 04/07/21 1933 Oral     SpO2 04/07/21 1933 100 %     Weight 04/07/21 1938 138 lb (62.6 kg)     Height --      Head Circumference --      Peak Flow --      Pain Score 04/07/21 1931 8     Pain Loc --       Pain Edu? --      Excl. in [place]? --    No data found.  Updated Vital Signs Pulse (!) 125   Temp 98.5 F (36.9 C) (Oral)   Resp 20   Wt 138 lb (62.6  kg)   SpO2 100%   BMI 23.69 kg/m   Visual Acuity Right Eye Distance:   Left Eye Distance:   Bilateral Distance:    Right Eye Near:   Left Eye Near:    Bilateral Near:     Physical Exam Constitutional:      Comments: Appears uncomfortable.  Skin:    Comments: All visible skin surfaces including face show dry, scaly skin lesions and lichenified plaques consistent with chronic eczema.  Visible skin surfaces also include chest arms and legs.    Neurological:     Mental Status: She is alert.  Psychiatric:     Comments: Mood depressed, affect flat.  Patient tearful during H&P.  Expresses discomfort from eczema.     UC Treatments / Results  Labs (all labs ordered are listed, but only abnormal results are displayed) Labs Reviewed - No data to display  EKG   Radiology No results found.  Procedures Procedures (including critical care time)  Medications Ordered in UC Medications - No data to display  Initial Impression / Assessment and Plan / UC Course  I have reviewed the triage vital signs and the nursing notes.  Pertinent labs & imaging results that were available during my care of the patient were reviewed by me and considered in my medical decision making (see chart for details).  Chronic severe eczema flare.  Eucerin and hydrocortisone cream are not adequate.  Stressed to patient and her mother that she must see a dermatologist as soon as possible for appropriate therapy as I cannot prescribe the best options here in urgent care.  To to help manage her symptoms and bridge her care until she can get to dermatology, I prescribed 2 weeks of methylprednisolone pills followed by a month of a taper.  Patient is already taking hydroxyzine 25 mg, patient can take it up to 3 times a day but reports to me she is only taking it  once a day.  I advised her to take it 3 times a day at the 25 mg dose for itching and let her know she can increase to 50 mg 3 times a day if needed but to start with a lower dose first.   Final Clinical Impressions(s) / UC Diagnoses   Final diagnoses:  Severe eczema     Discharge Instructions      Call [facility] tomorrow to get an urgent appointment for severe, whole body eczema.   Take the methylprednisolone as prescribed.  You will take a full dose (8 pills a day) for 2 weeks and then slowly taper off of it over another month.   Continue using Eucerin on your skin. Pay attention to any open areas and seek medical attention if you think the open areas might be getting infected.   You can take hydroxyzine 25-50mg  three times a day for itching. You are currently prescribed to take only 25mg  three times a day.    ED Prescriptions     Medication Sig Dispense Auth. Provider   methylPREDNISolone (MEDROL) 4 MG tablet Take 8 tablets (32 mg total) by mouth daily for 14 days, THEN 7 tablets (28 mg total) daily for 4 days, THEN 6 tablets (24 mg total) daily for 4 days, THEN 5 tablets (20 mg total) daily for 4 days, THEN 4 tablets (16 mg total) daily for 4 days, THEN 3 tablets (12 mg total) daily for 4 days, THEN 2 tablets (8 mg total) daily for 4 days, THEN 1 tablet (4  mg total) daily for 4 days. 224 tablet Carvel Getting, NP      PDMP not reviewed this encounter.   Carvel Getting, NP 04/08/21 1500

## 2021-04-29 ENCOUNTER — Other Ambulatory Visit (HOSPITAL_COMMUNITY): Payer: Self-pay

## 2021-05-01 ENCOUNTER — Other Ambulatory Visit (HOSPITAL_COMMUNITY): Payer: Self-pay

## 2021-05-06 ENCOUNTER — Other Ambulatory Visit (HOSPITAL_COMMUNITY): Payer: Self-pay

## 2021-05-07 ENCOUNTER — Other Ambulatory Visit (HOSPITAL_COMMUNITY): Payer: Self-pay

## 2021-05-09 ENCOUNTER — Other Ambulatory Visit (HOSPITAL_COMMUNITY): Payer: Self-pay

## 2021-05-13 ENCOUNTER — Other Ambulatory Visit (HOSPITAL_COMMUNITY): Payer: Self-pay

## 2021-05-14 ENCOUNTER — Other Ambulatory Visit (HOSPITAL_COMMUNITY): Payer: Self-pay

## 2021-05-20 ENCOUNTER — Other Ambulatory Visit (HOSPITAL_COMMUNITY): Payer: Self-pay

## 2021-05-22 ENCOUNTER — Other Ambulatory Visit (HOSPITAL_COMMUNITY): Payer: Self-pay

## 2021-11-13 ENCOUNTER — Encounter (HOSPITAL_COMMUNITY): Payer: Self-pay | Admitting: Emergency Medicine

## 2021-11-13 ENCOUNTER — Emergency Department (HOSPITAL_COMMUNITY)
Admission: EM | Admit: 2021-11-13 | Discharge: 2021-11-13 | Disposition: A | Discharge status: Home / Self Care | Payer: Medicaid Other | Attending: Emergency Medicine | Admitting: Emergency Medicine

## 2021-11-13 DIAGNOSIS — Z7951 Long term (current) use of inhaled steroids: Secondary | ICD-10-CM | POA: Insufficient documentation

## 2021-11-13 DIAGNOSIS — J45909 Unspecified asthma, uncomplicated: Secondary | ICD-10-CM | POA: Diagnosis not present

## 2021-11-13 DIAGNOSIS — J020 Streptococcal pharyngitis: Secondary | ICD-10-CM | POA: Insufficient documentation

## 2021-11-13 DIAGNOSIS — J029 Acute pharyngitis, unspecified: Secondary | ICD-10-CM | POA: Diagnosis present

## 2021-11-13 LAB — GROUP A STREP BY PCR: Group A Strep by PCR: DETECTED — AB

## 2021-11-13 MED ORDER — AMOXICILLIN 500 MG PO CAPS: 500.0000 mg | ORAL_CAPSULE | Freq: Two times a day (BID) | ORAL | 0 refills | Status: AC

## 2021-11-13 NOTE — ED Provider Notes (Signed)
?[facility] ?Provider Note ? ? ?CSN: [id number] ?Arrival date & time: 11/13/21  4098 ? ?  ? ?History ? ?Chief Complaint  ?Patient presents with  ? Sore Throat  ? ? ?Jeanette Hall is a 21 y.o. female. ? ?Patient presents with sore throat for several days.  Son is here and is positive for strep.  No meds PTA, history of asthma, no other pertinent past medical history. ? ? ?  ? ?Home Medications ?Prior to Admission medications   ?Medication Sig Start Date End Date Taking? Authorizing Provider  ?amoxicillin (AMOXIL) 500 MG capsule Take 1 capsule (500 mg total) by mouth 2 (two) times daily for 10 days. 11/13/21 11/23/21 Yes Charmayne Sheer, NP  ?albuterol (VENTOLIN HFA) 108 (90 Base) MCG/ACT inhaler Inhale 2 puffs into the lungs every 6 (six) hours as needed for wheezing or shortness of breath. ?Patient taking differently: Inhale 2-3 puffs into the lungs every 6 (six) hours as needed for wheezing or shortness of breath. 05/30/19   Jaynee Eagles, PA-C  ?docusate sodium (COLACE) 100 MG capsule Take 1 capsule (100 mg total) by mouth daily as needed for moderate constipation. 04/03/21   Maida Sale, MD  ?FLUoxetine (PROZAC) 10 MG capsule Take 1 capsule (10 mg total) by mouth daily. 03/31/21   Shelly Coss, MD  ?FLUoxetine (PROZAC) 10 MG capsule Take 1 capsule by mouth daily 04/03/21   Maida Sale, MD  ?hydrocerin (EUCERIN) CREA Apply 1 application topically 2 (two) times daily. 03/31/21   Shelly Coss, MD  ?hydrocortisone cream 1 % Apply topically 2 (two) times daily. 03/31/21   Shelly Coss, MD  ?hydrOXYzine (ATARAX/VISTARIL) 25 MG tablet Take 1 tablet (25 mg total) by mouth 3 (three) times daily as needed for anxiety. 04/03/21   Maida Sale, MD  ?Levonorgestrel (LILETTA, 52 MG, IU) 52 mg by Intrauterine route continuous. Inserted 09/01/2018 09/01/18   [provider]  ?melatonin 5 MG TABS Take 1 tablet (5 mg total) by mouth at bedtime. 04/03/21   Maida Sale, MD  ?cetirizine (ZYRTEC) 10 MG tablet Take 1 tablet (10 mg total) by mouth daily. ?Patient not taking: Reported on 03/21/2019 12/31/17 05/30/19  Kennith Gain, MD  ?fluticasone [facility]) 50 MCG/ACT nasal spray Place 2 sprays into both nostrils daily. ?Patient not taking: Reported on 03/21/2019 11/11/17 05/30/19  Morene Crocker, CNM  ?   ? ?Allergies    ?Fish allergy   ? ?Review of Systems   ?Review of Systems  ?Constitutional:  Negative for fever.  ?HENT:  Positive for sore throat.   ?Respiratory:  Negative for cough.   ?All other systems reviewed and are negative. ? ?Physical Exam ?Updated Vital Signs ?BP 132/67 (BP Location: Right Arm)   Pulse 64   Temp 98.1 ?F (36.7 ?C)   Resp 17   SpO2 99%  ?Physical Exam ?Vitals and nursing note reviewed.  ?Constitutional:   ?   General: She is not in acute distress. ?   Appearance: She is well-developed.  ?HENT:  ?   Head: Normocephalic and atraumatic.  ?   Nose: No congestion.  ?   Mouth/Throat:  ?   Mouth: Mucous membranes are moist.  ?   Pharynx: Posterior oropharyngeal erythema present. No oropharyngeal exudate.  ?   Tonsils: 2+ on the right. 2+ on the left.  ?Eyes:  ?   Conjunctiva/sclera: Conjunctivae normal.  ?   Pupils: Pupils are equal, round, and reactive to light.  ?Cardiovascular:  ?  Rate and Rhythm: Normal rate and regular rhythm.  ?Pulmonary:  ?   Effort: Pulmonary effort is normal.  ?Abdominal:  ?   General: There is no distension.  ?   Palpations: Abdomen is soft.  ?Musculoskeletal:  ?   Cervical back: Normal range of motion and neck supple.  ?Lymphadenopathy:  ?   Cervical: Cervical adenopathy present.  ?Skin: ?   General: Skin is warm and dry.  ?   Capillary Refill: Capillary refill takes less than 2 seconds.  ?Neurological:  ?   General: No focal deficit present.  ?   Mental Status: She is alert and oriented to person, place, and time.  ? ? ?ED Results / Procedures / Treatments   ?Labs ?(all labs ordered are listed, but only  abnormal results are displayed) ?Labs Reviewed  ?GROUP A STREP BY PCR - Abnormal; Notable for the following components:  ?    Result Value  ? Group A Strep by PCR DETECTED (*)   ? All other components within normal limits  ? ? ?EKG ?None ? ?Radiology ?No results found. ? ?Procedures ?Procedures  ? ? ?Medications Ordered in ED ?Medications - No data to display ? ?ED Course/ Medical Decision Making/ A&P ?  ?                        ?Medical Decision Making ?Risk ?Prescription drug management. ? ? ?This patient presents to the ED for concern of sore throat, this involves an extensive number of treatment options, and is a complaint that carries with it a high risk of complications and morbidity.  The differential diagnosis includes viral pharyngitis, strep, RPA, PTA ? ?Co morbidities that complicate the patient evaluation ? ?Asthma ? ?Additional history obtained from patient ? ?External records from outside source obtained and reviewed including none available ? ?Lab Tests: ? ?I Ordered, and personally interpreted labs.  The pertinent results include: Strep positive ? ?I do not think she needs imaging at this time. ?Medicines ordered and prescription drug management: ? ?I ordered medication including Amoxil for strep infection ?Reevaluation of the patient after these medicines showed that the patient stayed the same ?I have reviewed the patients home medicines and have made adjustments as needed ? ?Problem List / ED Course: ? ?21 year old female with history of asthma with several days of sore throat.  Son here being evaluated and tested positive for strep.  Patient is also strep positive.  Will treat with Amoxil.  Otherwise well-appearing. ? ?Reevaluation: ? ?After the interventions noted above, I reevaluated the patient and found that they have :stayed the same ? ? ? ?Dispostion: ? ?After consideration of the diagnostic results and the patients response to treatment, I feel that the patent would benefit from discharge  home. Discussed supportive care as well need for f/u w/ PCP in 1-2 days.  Also discussed sx that warrant sooner re-eval in ED. ?Patient / Family / Caregiver informed of clinical course, understand medical decision-making process, and agree with plan. ? ? ? ? ? ? ? ? ? ?Final Clinical Impression(s) / ED Diagnoses ?Final diagnoses:  ?Strep pharyngitis  ? ? ?Rx / DC Orders ?ED Discharge Orders   ? ?      Ordered  ?  amoxicillin (AMOXIL) 500 MG capsule  2 times daily       ? 11/13/21 0241  ? ?  ?  ? ?  ? ? ?  ?Charmayne Sheer, NP ?11/13/21 [phone] ? ?  ?  Ripley Fraise, MD ?11/13/21 [phone] ? ?

## 2021-11-13 NOTE — ED Triage Notes (Signed)
Pt here with son and wanting to be checked out/tested for strep. ?

## 2021-11-14 ENCOUNTER — Telehealth: Payer: Self-pay

## 2021-11-14 NOTE — Telephone Encounter (Signed)
Transition Care Management Follow-up Telephone Call ?Date of discharge and from where: 11/13/2021-[facility] ?How have you been since you were released from the hospital? Pt stated she is doing ok.  ?Any questions or concerns? No ? ?Items Reviewed: ?Did the pt receive and understand the discharge instructions provided? Yes  ?Medications obtained and verified? Yes  ?Other? No  ?Any new allergies since your discharge? No  ?Dietary orders reviewed? No ?Do you have support at home? Yes  ? ?Home Care and Equipment/Supplies: ?Were home health services ordered? not applicable ?If so, what is the name of the agency? N/A  ?Has the agency set up a time to come to the patient's home? not applicable ?Were any new equipment or medical supplies ordered?  No ?What is the name of the medical supply agency? N/A ?Were you able to get the supplies/equipment? not applicable ?Do you have any questions related to the use of the equipment or supplies? No ? ?Functional Questionnaire: (I = Independent and D = Dependent) ?ADLs: I ? ?Bathing/Dressing- I ? ?Meal Prep- I ? ?Eating- I ? ?Maintaining continence- I ? ?Transferring/Ambulation- I ? ?Managing Meds- I ? ?Follow up appointments reviewed: ? ?PCP Hospital f/u appt confirmed? No   ?Specialist Hospital f/u appt confirmed? No   ?Are transportation arrangements needed? No  ?If their condition worsens, is the pt aware to call PCP or go to the Emergency Dept.? Yes ?Was the patient provided with contact information for the PCP's office or ED? Yes ?Was to pt encouraged to call back with questions or concerns? Yes  ?

## 2022-02-03 ENCOUNTER — Other Ambulatory Visit (HOSPITAL_COMMUNITY)
Admission: RE | Admit: 2022-02-03 | Discharge: 2022-02-03 | Disposition: A | Discharge status: Home / Self Care | Payer: Medicaid Other | Source: Ambulatory Visit | Attending: Obstetrics and Gynecology | Admitting: Obstetrics and Gynecology

## 2022-02-03 ENCOUNTER — Ambulatory Visit (INDEPENDENT_AMBULATORY_CARE_PROVIDER_SITE_OTHER): Payer: Medicaid Other | Admitting: *Deleted

## 2022-02-03 DIAGNOSIS — N898 Other specified noninflammatory disorders of vagina: Secondary | ICD-10-CM | POA: Insufficient documentation

## 2022-02-03 DIAGNOSIS — Z113 Encounter for screening for infections with a predominantly sexual mode of transmission: Secondary | ICD-10-CM | POA: Diagnosis not present

## 2022-02-03 NOTE — Progress Notes (Signed)
SUBJECTIVE:  21 y.o. female complains of vaginal discharge with itching for 2-3 days. Denies abnormal vaginal bleeding or significant pelvic pain or fever. No UTI symptoms. Denies history of known exposure to STD.   OBJECTIVE:  She appears well, afebrile. Urine dipstick: not done.  ASSESSMENT:  Vaginal Discharge  Vaginal Odor   PLAN:  GC, chlamydia, trichomonas, BVAG, CVAG probe sent to lab. Treatment: To be determined once lab results are received ROV prn if symptoms persist or worsen.

## 2022-02-04 LAB — CERVICOVAGINAL ANCILLARY ONLY
Bacterial Vaginitis (gardnerella): POSITIVE — AB
Candida Glabrata: NEGATIVE
Candida Vaginitis: NEGATIVE
Chlamydia: NEGATIVE
Comment: NEGATIVE
Comment: NEGATIVE
Comment: NEGATIVE
Comment: NEGATIVE
Comment: NEGATIVE
Comment: NORMAL
Neisseria Gonorrhea: NEGATIVE
Trichomonas: NEGATIVE

## 2022-02-04 NOTE — Progress Notes (Signed)
Patient was assessed and managed by nursing staff during this encounter. I have reviewed the chart and agree with the documentation and plan. I have also made any necessary editorial changes.  Griffin Basil, MD 02/04/2022 1:09 PM

## 2022-02-06 ENCOUNTER — Other Ambulatory Visit: Payer: Self-pay

## 2022-02-06 DIAGNOSIS — B9689 Other specified bacterial agents as the cause of diseases classified elsewhere: Secondary | ICD-10-CM

## 2022-02-06 MED ORDER — METRONIDAZOLE 500 MG PO TABS
500.0000 mg | ORAL_TABLET | Freq: Two times a day (BID) | ORAL | 0 refills | Status: DC
Start: 2022-02-06 — End: 2022-04-07

## 2022-02-09 ENCOUNTER — Encounter: Payer: Self-pay | Admitting: Obstetrics & Gynecology

## 2022-02-09 ENCOUNTER — Ambulatory Visit (INDEPENDENT_AMBULATORY_CARE_PROVIDER_SITE_OTHER): Payer: Medicaid Other | Admitting: Obstetrics & Gynecology

## 2022-02-09 VITALS — BP 125/80 | HR 69 | Ht 64.0 in | Wt 154.0 lb

## 2022-02-09 DIAGNOSIS — Z30432 Encounter for removal of intrauterine contraceptive device: Secondary | ICD-10-CM | POA: Diagnosis not present

## 2022-02-09 DIAGNOSIS — L309 Dermatitis, unspecified: Secondary | ICD-10-CM

## 2022-02-09 MED ORDER — TRIAMCINOLONE ACETONIDE 0.5 % EX OINT: 1.0000 | TOPICAL_OINTMENT | Freq: Two times a day (BID) | CUTANEOUS | 3 refills | Status: DC

## 2022-02-09 NOTE — Progress Notes (Signed)
Pt would like IUD removed today, unsure if she plans to start another method.

## 2022-02-09 NOTE — Progress Notes (Signed)
    GYNECOLOGY OFFICE PROCEDURE NOTE  Jeanette Hall is a 21 y.o. G1P1001 here for Paragard IUD removal. No GYN concerns.  Just desires to remove it.  IUD Removal  Patient identified, informed consent performed, consent signed.  Patient was placed in the dorsal lithotomy position, normal external genitalia was noted.  A speculum was placed in the patient's vagina, normal discharge was noted, no lesions. The cervix was visualized, no lesions, no abnormal discharge.  The IUD was noted to be party out of the cervix, it was grasped and removed in its entirety.   Patient tolerated the procedure well.    Patient will use condoms for contraception.  She was advised that if she plans for pregnancy soon and she was to avoid teratogens, take PNV and folic acid.  Routine preventative health maintenance measures emphasized. Of note, refilled triamcinolone ointment for patient's eczema.   Verita Schneiders, MD, [place] for [employer], [place]

## 2022-04-05 ENCOUNTER — Emergency Department (HOSPITAL_COMMUNITY)
Admission: EM | Admit: 2022-04-05 | Discharge: 2022-04-06 | Disposition: A | Discharge status: Home / Self Care | Payer: Medicaid Other | Attending: Emergency Medicine | Admitting: Emergency Medicine

## 2022-04-05 ENCOUNTER — Other Ambulatory Visit: Payer: Self-pay

## 2022-04-05 ENCOUNTER — Encounter (HOSPITAL_COMMUNITY): Payer: Self-pay | Admitting: Emergency Medicine

## 2022-04-05 DIAGNOSIS — R45851 Suicidal ideations: Secondary | ICD-10-CM | POA: Insufficient documentation

## 2022-04-05 DIAGNOSIS — F331 Major depressive disorder, recurrent, moderate: Secondary | ICD-10-CM | POA: Insufficient documentation

## 2022-04-05 DIAGNOSIS — R000 Tachycardia, unspecified: Secondary | ICD-10-CM | POA: Diagnosis not present

## 2022-04-05 DIAGNOSIS — Z20822 Contact with and (suspected) exposure to covid-19: Secondary | ICD-10-CM | POA: Insufficient documentation

## 2022-04-05 DIAGNOSIS — L309 Dermatitis, unspecified: Secondary | ICD-10-CM | POA: Diagnosis not present

## 2022-04-05 DIAGNOSIS — Z3A00 Weeks of gestation of pregnancy not specified: Secondary | ICD-10-CM | POA: Diagnosis not present

## 2022-04-05 DIAGNOSIS — O0289 Other abnormal products of conception: Secondary | ICD-10-CM | POA: Diagnosis not present

## 2022-04-05 DIAGNOSIS — Z3A01 Less than 8 weeks gestation of pregnancy: Secondary | ICD-10-CM | POA: Diagnosis not present

## 2022-04-05 DIAGNOSIS — O26891 Other specified pregnancy related conditions, first trimester: Secondary | ICD-10-CM | POA: Diagnosis not present

## 2022-04-05 DIAGNOSIS — Z3491 Encounter for supervision of normal pregnancy, unspecified, first trimester: Secondary | ICD-10-CM

## 2022-04-05 LAB — CBC
HCT: 40.8 % (ref 36.0–46.0)
Hemoglobin: 13 g/dL (ref 12.0–15.0)
MCH: 28.1 pg (ref 26.0–34.0)
MCHC: 31.9 g/dL (ref 30.0–36.0)
MCV: 88.1 fL (ref 80.0–100.0)
Platelets: 449 10*3/uL — ABNORMAL HIGH (ref 150–400)
RBC: 4.63 MIL/uL (ref 3.87–5.11)
RDW: 14.2 % (ref 11.5–15.5)
WBC: 10 10*3/uL (ref 4.0–10.5)
nRBC: 0 % (ref 0.0–0.2)

## 2022-04-05 LAB — COMPREHENSIVE METABOLIC PANEL
ALT: 25 U/L (ref 0–44)
AST: 32 U/L (ref 15–41)
Albumin: 3.6 g/dL (ref 3.5–5.0)
Alkaline Phosphatase: 70 U/L (ref 38–126)
Anion gap: 13 (ref 5–15)
BUN: 5 mg/dL — ABNORMAL LOW (ref 6–20)
CO2: 20 mmol/L — ABNORMAL LOW (ref 22–32)
Calcium: 9.3 mg/dL (ref 8.9–10.3)
Chloride: 106 mmol/L (ref 98–111)
Creatinine, Ser: 1.02 mg/dL — ABNORMAL HIGH (ref 0.44–1.00)
GFR, Estimated: >60 mL/min (ref >60)
Glucose, Bld: 155 mg/dL — ABNORMAL HIGH (ref 70–99)
Potassium: 3.5 mmol/L (ref 3.5–5.1)
Sodium: 139 mmol/L (ref 135–145)
Total Bilirubin: 0.7 mg/dL (ref 0.3–1.2)
Total Protein: 6.5 g/dL (ref 6.5–8.1)

## 2022-04-05 LAB — SALICYLATE LEVEL: Salicylate Lvl: <7 mg/dL — ABNORMAL LOW (ref 7.0–30.0)

## 2022-04-05 LAB — ACETAMINOPHEN LEVEL: Acetaminophen (Tylenol), Serum: <10 ug/mL — ABNORMAL LOW (ref 10–30)

## 2022-04-05 LAB — HCG, SERUM, QUALITATIVE: Preg, Serum: POSITIVE — AB

## 2022-04-05 LAB — I-STAT BETA HCG BLOOD, ED (MC, WL, AP ONLY): I-stat hCG, quantitative: 91.1 m[IU]/mL — ABNORMAL HIGH (ref <5)

## 2022-04-05 LAB — ETHANOL: Alcohol, Ethyl (B): <10 mg/dL (ref <10)

## 2022-04-05 MED ORDER — ALBUTEROL SULFATE HFA 108 (90 BASE) MCG/ACT IN AERS
2.0000 | INHALATION_SPRAY | RESPIRATORY_TRACT | Status: DC | PRN
  Administered 2022-04-05: 2 via RESPIRATORY_TRACT
  Filled 2022-04-05: qty 6.7

## 2022-04-05 MED ORDER — ACETAMINOPHEN 325 MG PO TABS
650.0000 mg | ORAL_TABLET | Freq: Four times a day (QID) | ORAL | Status: DC | PRN
  Administered 2022-04-06: 650 mg via ORAL
  Filled 2022-04-05: qty 2

## 2022-04-05 NOTE — ED Triage Notes (Signed)
Patient here w/ ezcema flare up and is feeling suicidal due to the flare up. Denies specific plant or intent. Pt tearful stating "I don't want to be alive anymore".  Patient denies HI. Patient states she's been out of her antidepressants and eczema medications for a long time and is feeling hopeless because of this.

## 2022-04-05 NOTE — ED Notes (Signed)
Belongings placed in locker number 6

## 2022-04-05 NOTE — ED Provider Notes (Signed)
[facility] EMERGENCY DEPARTMENT Provider Note   CSN: [id number] Arrival date & time: 04/05/22  1548     History  Chief Complaint  Patient presents with   eczema flare up    Suicidal    NYIESHA BEEVER is a 21 y.o. female.  Patient is a 21 year old who presents with eczema flareup and suicidal ideations.  She has a history of eczema and says its been pretty much had to flareup for the last few years.  She had previously seen a dermatologist as a child but has not seen one in all while.  She was using Aquaphor which sometimes helps but she now is only using Vaseline.  She is not any other medications for her eczema.  She says that she is suicidal and does not want to go on living anymore.  Of note on chart review, she was admitted about a year ago for suicidal ideations and overdose attempt.  She denies any fevers or other recent illnesses.       Home Medications Prior to Admission medications   Medication Sig Start Date End Date Taking? Authorizing Provider  FLUoxetine (PROZAC) 10 MG capsule Take 1 capsule (10 mg total) by mouth daily. Patient not taking: Reported on 02/09/2022 03/31/21   Shelly Coss, MD  hydrocerin (EUCERIN) CREA Apply 1 application topically 2 (two) times daily. 03/31/21   Shelly Coss, MD  hydrocortisone cream 1 % Apply topically 2 (two) times daily. 03/31/21   Shelly Coss, MD  hydrOXYzine (ATARAX/VISTARIL) 25 MG tablet Take 1 tablet (25 mg total) by mouth 3 (three) times daily as needed for anxiety. Patient not taking: Reported on 02/09/2022 04/03/21   Maida Sale, MD  Levonorgestrel (LILETTA, 52 MG, IU) 52 mg by Intrauterine route continuous. Inserted 09/01/2018 09/01/18   [provider]  melatonin 5 MG TABS Take 1 tablet (5 mg total) by mouth at bedtime. 04/03/21   Maida Sale, MD  metroNIDAZOLE (FLAGYL) 500 MG tablet Take 1 tablet (500 mg total) by mouth 2 (two) times daily. 02/06/22   Griffin Basil, MD   triamcinolone ointment (KENALOG) 0.5 % Apply 1 Application topically 2 (two) times daily. 02/09/22   Anyanwu, Sallyanne Havers, MD  cetirizine (ZYRTEC) 10 MG tablet Take 1 tablet (10 mg total) by mouth daily. Patient not taking: Reported on 03/21/2019 12/31/17 05/30/19  Kennith Gain, MD  fluticasone [facility]) 50 MCG/ACT nasal spray Place 2 sprays into both nostrils daily. Patient not taking: Reported on 03/21/2019 11/11/17 05/30/19  Kandis Cocking A, CNM      Allergies    Fish allergy    Review of Systems   Review of Systems  Constitutional:  Negative for chills, diaphoresis, fatigue and fever.  HENT:  Negative for congestion, rhinorrhea and sneezing.   Eyes: Negative.   Respiratory:  Negative for cough, chest tightness and shortness of breath.   Cardiovascular:  Negative for chest pain and leg swelling.  Gastrointestinal:  Negative for abdominal pain, blood in stool, diarrhea, nausea and vomiting.  Genitourinary:  Negative for difficulty urinating, flank pain, frequency and hematuria.  Musculoskeletal:  Negative for arthralgias and back pain.  Skin:  Positive for rash.  Neurological:  Negative for dizziness, speech difficulty, weakness, numbness and headaches.  Psychiatric/Behavioral:  Positive for suicidal ideas.     Physical Exam Updated Vital Signs BP (!) 115/57   Pulse 67   Temp 98.8 F (37.1 C) (Oral)   Resp 18   LMP 02/07/2022   SpO2  100%  Physical Exam Constitutional:      Appearance: She is well-developed.  HENT:     Head: Normocephalic and atraumatic.  Eyes:     Pupils: Pupils are equal, round, and reactive to light.  Cardiovascular:     Rate and Rhythm: Normal rate and regular rhythm.     Heart sounds: Normal heart sounds.  Pulmonary:     Effort: Pulmonary effort is normal. No respiratory distress.     Breath sounds: Normal breath sounds. No wheezing or rales.  Chest:     Chest wall: No tenderness.  Abdominal:     General: Bowel sounds are normal.      Palpations: Abdomen is soft.     Tenderness: There is no abdominal tenderness. There is no guarding or rebound.  Musculoskeletal:        General: Normal range of motion.     Cervical back: Normal range of motion and neck supple.  Lymphadenopathy:     Cervical: No cervical adenopathy.  Skin:    General: Skin is warm and dry.     Findings: No rash.     Comments: Diffuse scaly eczema-like rash.  There is some areas that have some clear oozing but she does not have any areas that appear to be infected.  Neurological:     Mental Status: She is alert and oriented to person, place, and time.     ED Results / Procedures / Treatments   Labs (all labs ordered are listed, but only abnormal results are displayed) Labs Reviewed  COMPREHENSIVE METABOLIC PANEL - Abnormal; Notable for the following components:      Result Value   CO2 20 (*)    Glucose, Bld 155 (*)    BUN 5 (*)    Creatinine, Ser 1.02 (*)    All other components within normal limits  SALICYLATE LEVEL - Abnormal; Notable for the following components:   Salicylate Lvl <7.0 (*)    All other components within normal limits  ACETAMINOPHEN LEVEL - Abnormal; Notable for the following components:   Acetaminophen (Tylenol), Serum <10 (*)    All other components within normal limits  CBC - Abnormal; Notable for the following components:   Platelets 449 (*)    All other components within normal limits  HCG, SERUM, QUALITATIVE - Abnormal; Notable for the following components:   Preg, Serum POSITIVE (*)    All other components within normal limits  I-STAT BETA HCG BLOOD, ED ([facility], [facility], AP ONLY) - Abnormal; Notable for the following components:   I-stat hCG, quantitative 91.1 (*)    All other components within normal limits  ETHANOL  RAPID URINE DRUG SCREEN, HOSP PERFORMED  POC URINE PREG, ED    EKG None  Radiology No results found.  Procedures Procedures    Medications Ordered in ED Medications  albuterol (VENTOLIN HFA) 108  (90 Base) MCG/ACT inhaler 2 puff (2 puffs Inhalation Given 04/05/22 1650)  acetaminophen (TYLENOL) tablet 650 mg (has no administration in time range)    ED Course/ Medical Decision Making/ A&P                           Medical Decision Making Amount and/or Complexity of Data Reviewed Labs: ordered.  Risk OTC drugs. Prescription drug management.   Patient is a 21 year old who presents with suicidal ideations and a diffuse eczema flareup.  It sounds like this is a chronic issue for her recently.  She has been  using Vaseline.  She needs to follow-up with a dermatologist.  This was discussed with her.  She does not have any suggestions of infection.  Her labs are nonconcerning.  She incidentally does have a positive pregnancy test.  Her quant is very low.  She does not have any abdominal pain or bleeding.  She said that her menstrual cycle was due around this time but she thought that it may be in a few days late.  I discussed with her the importance of following up with an OB/GYN.  She currently is medically cleared and awaiting TTS evaluation.  Final Clinical Impression(s) / ED Diagnoses Final diagnoses:  Suicidal ideation  First trimester pregnancy    Rx / DC Orders ED Discharge Orders     None         Malvin Johns, MD 04/05/22 2307

## 2022-04-06 ENCOUNTER — Other Ambulatory Visit (HOSPITAL_COMMUNITY)
Admission: EM | Admit: 2022-04-06 | Discharge: 2022-04-08 | Disposition: A | Discharge status: Home / Self Care | Payer: Medicaid Other | Attending: Psychiatry | Admitting: Psychiatry

## 2022-04-06 DIAGNOSIS — Z79899 Other long term (current) drug therapy: Secondary | ICD-10-CM | POA: Insufficient documentation

## 2022-04-06 DIAGNOSIS — F331 Major depressive disorder, recurrent, moderate: Secondary | ICD-10-CM

## 2022-04-06 DIAGNOSIS — O99511 Diseases of the respiratory system complicating pregnancy, first trimester: Secondary | ICD-10-CM | POA: Insufficient documentation

## 2022-04-06 DIAGNOSIS — Z3490 Encounter for supervision of normal pregnancy, unspecified, unspecified trimester: Secondary | ICD-10-CM

## 2022-04-06 DIAGNOSIS — O99711 Diseases of the skin and subcutaneous tissue complicating pregnancy, first trimester: Secondary | ICD-10-CM | POA: Diagnosis not present

## 2022-04-06 DIAGNOSIS — F332 Major depressive disorder, recurrent severe without psychotic features: Secondary | ICD-10-CM | POA: Diagnosis not present

## 2022-04-06 DIAGNOSIS — J45909 Unspecified asthma, uncomplicated: Secondary | ICD-10-CM | POA: Insufficient documentation

## 2022-04-06 DIAGNOSIS — L309 Dermatitis, unspecified: Secondary | ICD-10-CM | POA: Insufficient documentation

## 2022-04-06 DIAGNOSIS — O99321 Drug use complicating pregnancy, first trimester: Secondary | ICD-10-CM | POA: Insufficient documentation

## 2022-04-06 DIAGNOSIS — F1110 Opioid abuse, uncomplicated: Secondary | ICD-10-CM | POA: Insufficient documentation

## 2022-04-06 DIAGNOSIS — O99341 Other mental disorders complicating pregnancy, first trimester: Secondary | ICD-10-CM | POA: Diagnosis not present

## 2022-04-06 DIAGNOSIS — Z3A01 Less than 8 weeks gestation of pregnancy: Secondary | ICD-10-CM | POA: Insufficient documentation

## 2022-04-06 DIAGNOSIS — Z9151 Personal history of suicidal behavior: Secondary | ICD-10-CM | POA: Insufficient documentation

## 2022-04-06 HISTORY — DX: Major depressive disorder, recurrent, moderate: F33.1

## 2022-04-06 HISTORY — DX: Depression, unspecified: F32.A

## 2022-04-06 LAB — RESP PANEL BY RT-PCR (FLU A&B, COVID) ARPGX2
Influenza A by PCR: NEGATIVE
Influenza B by PCR: NEGATIVE
SARS Coronavirus 2 by RT PCR: NEGATIVE

## 2022-04-06 MED ORDER — AVEENO MOISTURIZING EX BAR
CHEWABLE_BAR | Freq: Every day | CUTANEOUS | Status: DC
  Filled 2022-04-06 (×2): qty 1

## 2022-04-06 MED ORDER — FLUOXETINE HCL 10 MG PO CAPS: 10.0000 mg | ORAL_CAPSULE | Freq: Every day | ORAL | Status: DC

## 2022-04-06 MED ORDER — LORATADINE 10 MG PO TABS: 10.0000 mg | ORAL_TABLET | Freq: Two times a day (BID) | ORAL | Status: DC

## 2022-04-06 MED ORDER — FOLIC ACID 1 MG PO TABS
1.0000 mg | ORAL_TABLET | Freq: Every day | ORAL | Status: DC
  Administered 2022-04-06 – 2022-04-07 (×2): 1 mg via ORAL
  Filled 2022-04-06 (×2): qty 1

## 2022-04-06 MED ORDER — CALAMINE EX LOTN
1.0000 | TOPICAL_LOTION | Freq: Three times a day (TID) | CUTANEOUS | Status: DC
  Administered 2022-04-06 – 2022-04-08 (×5): 1 via TOPICAL
  Filled 2022-04-06 (×2): qty 177

## 2022-04-06 MED ORDER — ENSURE ENLIVE PO LIQD
237.0000 mL | ORAL | Status: DC
  Administered 2022-04-06 – 2022-04-07 (×2): 237 mL via ORAL

## 2022-04-06 MED ORDER — AQUAPHOR EX OINT
TOPICAL_OINTMENT | Freq: Two times a day (BID) | CUTANEOUS | Status: DC
  Filled 2022-04-06 (×2): qty 50

## 2022-04-06 MED ORDER — MUPIROCIN CALCIUM 2 % EX CREA
1.0000 | TOPICAL_CREAM | Freq: Three times a day (TID) | CUTANEOUS | Status: DC
  Administered 2022-04-06 – 2022-04-08 (×5): 1 via TOPICAL
  Filled 2022-04-06: qty 15

## 2022-04-06 MED ORDER — HYDROCORTISONE 1 % EX CREA
TOPICAL_CREAM | Freq: Two times a day (BID) | CUTANEOUS | Status: DC
  Filled 2022-04-06: qty 28

## 2022-04-06 MED ORDER — TAB-A-VITE/IRON PO TABS: 1.0000 | ORAL_TABLET | Freq: Every day | ORAL | Status: DC

## 2022-04-06 MED ORDER — ACETAMINOPHEN 325 MG PO TABS: 650.0000 mg | ORAL_TABLET | Freq: Four times a day (QID) | ORAL | Status: DC | PRN

## 2022-04-06 MED ORDER — COMPLETENATE 29-1 MG PO CHEW
1.0000 | CHEWABLE_TABLET | Freq: Every day | ORAL | Status: DC
  Filled 2022-04-06: qty 1

## 2022-04-06 MED ORDER — ALBUTEROL SULFATE HFA 108 (90 BASE) MCG/ACT IN AERS: 2.0000 | INHALATION_SPRAY | RESPIRATORY_TRACT | Status: DC | PRN

## 2022-04-06 MED ORDER — HYDROCERIN EX CREA
1.0000 | TOPICAL_CREAM | Freq: Two times a day (BID) | CUTANEOUS | Status: DC
  Administered 2022-04-06 – 2022-04-08 (×4): 1 via TOPICAL
  Filled 2022-04-06 (×2): qty 113

## 2022-04-06 MED ORDER — SERTRALINE HCL 25 MG PO TABS
25.0000 mg | ORAL_TABLET | Freq: Every day | ORAL | Status: DC
  Administered 2022-04-06 – 2022-04-08 (×3): 25 mg via ORAL
  Filled 2022-04-06 (×2): qty 1
  Filled 2022-04-06: qty 7
  Filled 2022-04-06: qty 1

## 2022-04-06 MED ORDER — SERTRALINE HCL 25 MG PO TABS: 25.0000 mg | ORAL_TABLET | Freq: Every day | ORAL | Status: DC

## 2022-04-06 MED ORDER — HYDROCORTISONE 1 % EX CREA: TOPICAL_CREAM | Freq: Two times a day (BID) | CUTANEOUS | Status: DC

## 2022-04-06 MED ORDER — COMPLETENATE 29-1 MG PO CHEW
1.0000 | CHEWABLE_TABLET | Freq: Every day | ORAL | Status: DC
  Filled 2022-04-06 (×2): qty 1

## 2022-04-06 MED ORDER — MELATONIN 5 MG PO TABS: 5.0000 mg | ORAL_TABLET | Freq: Every day | ORAL | Status: DC

## 2022-04-06 NOTE — ED Notes (Signed)
Pt is in the bed sleeping. Respirations are even and unlabored. No acute distress noted. Will continue to monitor for safety. 

## 2022-04-06 NOTE — ED Provider Notes (Cosign Needed Addendum)
Facility Based Crisis Admission H&P  Date: 04/06/22 Patient Name: Jeanette Hall MRN: [id number] Chief Complaint: No chief complaint on file.     Diagnoses:  Final diagnoses:  Severe episode of recurrent major depressive disorder, without psychotic features [facility])    HPI: Jeanette Hall 21 y.o., female patient presented to [facility] on 04/05/2022 with SI and increased depression. Patient was evaluated today by psychiatry and was recommended for admission to the [facility].  Jeanette Hall, 21 y.o., female patient seen face to face by this provider, consulted with Dr. Dwyane Dee; and chart reviewed on 04/06/22.  Patient reports she currently has no outpatient psychiatric services in place.  She does not take any medications at this time.  During evaluation Jeanette Hall is observed sitting in the assessment room in no acute distress.  She is pleasant upon approach.  She is disheveled and makes fair eye contact.  Her eczema is visible on her face and she reports she has multiple areas on her body that is also affected. Her speech is clear, coherent, and normal rate and tone.  She is alert, oriented x 4, calm, and cooperative.  She continues to endorse increased depression with congruent affect.  She is endorsing some anxiety at this time related to just finding out that she is pregnant.  She previously endorsed suicidal ideations but at this time is denying any SI, intent, plan, or access to means.  She is denying HI/AVH.  Objectively she does not appear to be responding to internal/external stimuli.  She is denying any paranoia or delusional thought content.  Patient is able to answer questions appropriately.  Discussed admission to the facility based crisis unit.  Explained the milieu and expectations.  Patient is in agreement.  Discussed extensively Zoloft 25 mg for depression.  Discussed risk versus benefit and any adverse reactions/side effects related to pregnancy.  In addition printed education was provided.   Patient verbalized understanding and agreed to start Zoloft 25 mg daily.  We will consult with pharmacy regarding prenatal vitamin.  Patient has a fish allergy and the prenatal vitamin contains fish oil.  We will start folic acid at this time.  Nursing notified this writer of skin assessment. Per Soyla Murphy RN " observed severe dry skin on entire body with slight weeping from top of bil feet. Skin between 2nd and 3rd toes on R foot are emaciated and tender to touch. Writer, with the assist of another nurse, removed pt socks to assess area. Pt grimace as we were removing socks. MD notified immediately to assess skin. Pt have built up flaking and dryness all over face, head and entire body". Per EDP note at [facility], patient has been assessed including her affected skin areas. No infection was observed and she has been medically cleared.    PHQ 2-9:   [place] ED from 04/05/2022 in [place] ED from 04/07/2021 in [facility] Admission (Discharged) from 03/31/2021 in [place] [facility]  C-SSRS RISK CATEGORY High Risk Error: Q3, 4, or 5 should not be populated when Q2 is No Error: Question 6 not populated        Total Time spent with patient: 30 minutes  Musculoskeletal  Strength & Muscle Tone: within normal limits Gait & Station: normal Patient leans: N/A  Psychiatric Specialty Exam  Presentation General Appearance: Appropriate for Environment; Casual  Eye Contact:Good  Speech:Clear and Coherent  Speech Volume:Normal  Handedness:Right  Mood and Affect  Mood:Depressed  Affect:Congruent   Thought Process  Thought Processes:Coherent  Descriptions of Associations:Intact  Orientation:Full (Time, Place and Person)  Thought Content:Logical    Hallucinations:Hallucinations: None  Ideas of Reference:None  Suicidal Thoughts:Suicidal Thoughts: No  Homicidal Thoughts:Homicidal Thoughts:  No   Sensorium  Memory:Recent Good; Remote Good; Immediate Good  Judgment:Fair  Insight:Fair   Executive Functions  Concentration:Good  Attention Span:Good  [place] of Knowledge:Good  Language:Good   Psychomotor Activity  Psychomotor Activity:Psychomotor Activity: Normal   Assets  Assets:Communication Skills; Desire for Improvement; Physical Health; Resilience; Social Support   Sleep  Sleep:Sleep: Fair   Nutritional Assessment (For OBS and FBC admissions only) Has the patient had a weight loss or gain of 10 pounds or more in the last 3 months?: No Has the patient had a decrease in food intake/or appetite?: No Does the patient have dental problems?: No Does the patient have eating habits or behaviors that may be indicators of an eating disorder including binging or inducing vomiting?: No Has the patient recently lost weight without trying?: 2.0 Has the patient been eating poorly because of a decreased appetite?: 1 Malnutrition Screening Tool Score: 3    Physical Exam Vitals and nursing note reviewed.  Constitutional:      General: She is not in acute distress.    Appearance: Normal appearance. She is not ill-appearing.  HENT:     Head: Normocephalic.  Eyes:     General:        Right eye: No discharge.        Left eye: No discharge.     Conjunctiva/sclera: Conjunctivae normal.  Cardiovascular:     Rate and Rhythm: Normal rate.  Pulmonary:     Effort: Pulmonary effort is normal.  Musculoskeletal:        General: Normal range of motion.     Cervical back: Normal range of motion.  Skin:    General: Skin is warm and dry.  Neurological:     Mental Status: She is alert and oriented to person, place, and time.  Psychiatric:        Attention and Perception: Attention normal.        Mood and Affect: Affect normal. Mood is depressed.        Speech: Speech normal.        Behavior: Behavior normal. Behavior is cooperative.        Thought Content:  Thought content normal.        Cognition and Memory: Cognition normal.        Judgment: Judgment normal.    Review of Systems  Constitutional: Negative.   HENT: Negative.    Eyes: Negative.   Respiratory: Negative.    Cardiovascular: Negative.   Musculoskeletal: Negative.   Skin: Negative.   Neurological: Negative.   Psychiatric/Behavioral:  Positive for depression.     Blood pressure 128/78, pulse (!) 101, temperature 98.6 F (37 C), temperature source Oral, resp. rate 18, height '5\' 6"'$  (1.676 m), weight 130 lb (59 kg), last menstrual period 02/07/2022, SpO2 100 %. Body mass index is 20.98 kg/m.  Past Psychiatric History: MDD, anxiety and insomnia  Is the patient at risk to self? No  Has the patient been a risk to self in the past 6 months? Yes .    Has the patient been a risk to self within the distant past? Yes   Is the patient a risk to others? No   Has the patient been a risk  to others in the past 6 months? No   Has the patient been a risk to others within the distant past? No   Past Medical History:  Past Medical History:  Diagnosis Date   Angio-edema    Asthma    Eczema    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    Past Surgical History:  Procedure Laterality Date   MASS EXCISION N/A 05/05/2016   Procedure: EXCISION  OF TONGUE MASS;  Surgeon: Leta Baptist, MD;  Location: [place];  Service: ENT;  Laterality: N/A;  EXCISION  OF TONGUE MASS    Family History:  Family History  Problem Relation Age of Onset   Asthma Maternal Uncle    Hypertension Maternal Grandmother    Asthma Maternal Grandmother    Cancer Maternal Grandmother    Diabetes Maternal Grandmother    Cancer Paternal Grandmother    Hypertension Mother     Social History:  Social History   Socioeconomic History   Marital status: Single    Spouse name: Not on file   Number of children: Not on file   Years of education: Not on file   Highest education level: Not on file  Occupational History   Not  on file  Tobacco Use   Smoking status: Never   Smokeless tobacco: Never   Tobacco comments:    stepfather smokes outside  Media planner   Vaping Use: Never used  Substance and Sexual Activity   Alcohol use: No   Drug use: Not Currently   Sexual activity: Yes    Partners: Male    Birth control/protection: None, I.U.D.  Other Topics Concern   Not on file  Social History Narrative   Not on file   Social Determinants of Health   Financial Resource Strain: Not on file  Food Insecurity: Not on file  Transportation Needs: Not on file  Physical Activity: Not on file  Stress: Not on file  Social Connections: Not on file  Intimate Partner Violence: Not on file    SDOH:  SDOH Screenings   Alcohol Screen: Low Risk  (04/01/2021)  Tobacco Use: Low Risk  (04/05/2022)    Last Labs:  Admission on 04/05/2022, Discharged on 04/06/2022  Component Date Value Ref Range Status   Sodium 04/05/2022 139  135 - 145 mmol/L Final   Potassium 04/05/2022 3.5  3.5 - 5.1 mmol/L Final   Chloride 04/05/2022 106  98 - 111 mmol/L Final   CO2 04/05/2022 20 (L)  22 - 32 mmol/L Final   Glucose, Bld 04/05/2022 155 (H)  70 - 99 mg/dL Final   Glucose reference range applies only to samples taken after fasting for at least 8 hours.   BUN 04/05/2022 5 (L)  6 - 20 mg/dL Final   Creatinine, Ser 04/05/2022 1.02 (H)  0.44 - 1.00 mg/dL Final   Calcium 04/05/2022 9.3  8.9 - 10.3 mg/dL Final   Total Protein 04/05/2022 6.5  6.5 - 8.1 g/dL Final   Albumin 04/05/2022 3.6  3.5 - 5.0 g/dL Final   AST 04/05/2022 32  15 - 41 U/L Final   ALT 04/05/2022 25  0 - 44 U/L Final   Alkaline Phosphatase 04/05/2022 70  38 - 126 U/L Final   Total Bilirubin 04/05/2022 0.7  0.3 - 1.2 mg/dL Final   GFR, Estimated 04/05/2022 >60  >60 mL/min Final   Comment: (NOTE) Calculated using the CKD-EPI Creatinine Equation (2021)    Anion gap 04/05/2022 13  5 - 15 Final  Performed at [facility], [place] [address] [place], [place]  [address]   Salicylate Lvl 22/29/7989 <7.0 (L)  7.0 - 30.0 mg/dL Final   Performed at [place] [address], [place], [place] [address]   Acetaminophen (Tylenol), Serum 04/05/2022 <10 (L)  10 - 30 ug/mL Final   Comment: (NOTE) Therapeutic concentrations vary significantly. A range of 10-30 ug/mL  may be an effective concentration for many patients. However, some  are best treated at concentrations outside of this range. Acetaminophen concentrations >150 ug/mL at 4 hours after ingestion  and >50 ug/mL at 12 hours after ingestion are often associated with  toxic reactions.  Performed at [facility], [place] [address] [place], [place] [address]    WBC 04/05/2022 10.0  4.0 - 10.5 K/uL Final   RBC 04/05/2022 4.63  3.87 - 5.11 MIL/uL Final   Hemoglobin 04/05/2022 13.0  12.0 - 15.0 g/dL Final   HCT 04/05/2022 40.8  36.0 - 46.0 % Final   MCV 04/05/2022 88.1  80.0 - 100.0 fL Final   MCH 04/05/2022 28.1  26.0 - 34.0 pg Final   MCHC 04/05/2022 31.9  30.0 - 36.0 g/dL Final   RDW 04/05/2022 14.2  11.5 - 15.5 % Final   Platelets 04/05/2022 449 (H)  150 - 400 K/uL Final   nRBC 04/05/2022 0.0  0.0 - 0.2 % Final   Performed at [place] [address], [place], [place] [address]   I-stat hCG, quantitative 04/05/2022 91.1 (H)  <5 mIU/mL Final   Comment 3 04/05/2022          Final   Comment:   GEST. AGE      CONC.  (mIU/mL)   <=1 WEEK        5 - 50     2 WEEKS       50 - 500     3 WEEKS       100 - 10,000     4 WEEKS     1,000 - 30,000        FEMALE AND NON-PREGNANT FEMALE:     LESS THAN 5 mIU/mL    Alcohol, Ethyl (B) 04/05/2022 <10  <10 mg/dL Final   Comment: (NOTE) Lowest detectable limit for serum alcohol is 10 mg/dL.  For medical purposes only. Performed at [facility], [place] [address], [place], [place] [address]    Preg, Serum 04/05/2022 POSITIVE (A)  NEGATIVE Final   Comment:        THE SENSITIVITY OF THIS METHODOLOGY IS >10 mIU/mL. Performed at [facility], [place] [address] [place], [place] [address]    SARS Coronavirus 2 by RT PCR 04/05/2022 NEGATIVE  NEGATIVE Final   Comment: (NOTE) SARS-CoV-2 target nucleic acids are NOT DETECTED.  The SARS-CoV-2 RNA is generally detectable in upper respiratory specimens during the acute phase of infection. The lowest concentration of SARS-CoV-2 viral copies this assay can detect is 138 copies/mL. A negative result does not preclude SARS-Cov-2 infection and should not be used as the sole basis for treatment or other patient management decisions. A negative result may occur with  improper specimen collection/handling, submission of specimen other than nasopharyngeal swab, presence of viral mutation(s) within the areas targeted by this assay, and inadequate number of viral copies(<138 copies/mL). A negative result must be combined with clinical observations, patient history, and epidemiological information. The expected result is Negative.  Fact Sheet for Patients:  EntrepreneurPulse.com.au  Fact Sheet for Healthcare Providers:  IncredibleEmployment.be  This test is no                          t yet approved or cleared by the [place] FDA and  has been authorized for detection and/or diagnosis of SARS-CoV-2 by FDA under an Emergency Use Authorization (EUA). This EUA will remain  in effect (meaning this test can be used) for the duration of the COVID-19 declaration under Section 564(b)(1) of the Act, 21 U.S.C.section 360bbb-3(b)(1), unless the authorization is terminated  or revoked sooner.       Influenza A by PCR 04/05/2022 NEGATIVE  NEGATIVE Final   Influenza B by PCR 04/05/2022 NEGATIVE  NEGATIVE Final   Comment: (NOTE) The Xpert Xpress SARS-CoV-2/FLU/RSV plus assay is intended as an aid in the diagnosis of influenza from Nasopharyngeal swab specimens and should not be used as a sole basis for treatment. Nasal washings and aspirates are  unacceptable for Xpert Xpress SARS-CoV-2/FLU/RSV testing.  Fact Sheet for Patients: EntrepreneurPulse.com.au  Fact Sheet for Healthcare Providers: IncredibleEmployment.be  This test is not yet approved or cleared by the [place] FDA and has been authorized for detection and/or diagnosis of SARS-CoV-2 by FDA under an Emergency Use Authorization (EUA). This EUA will remain in effect (meaning this test can be used) for the duration of the COVID-19 declaration under Section 564(b)(1) of the Act, 21 U.S.C. section 360bbb-3(b)(1), unless the authorization is terminated or revoked.  Performed at [facility], [place] [address], [place], [place] [address]   Clinical Support on 02/03/2022  Component Date Value Ref Range Status   Neisseria Gonorrhea 02/03/2022 Negative   Final   Chlamydia 02/03/2022 Negative   Final   Trichomonas 02/03/2022 Negative   Final   Bacterial Vaginitis (gardnerella) 02/03/2022 Positive (A)   Final   Candida Vaginitis 02/03/2022 Negative   Final   Candida Glabrata 02/03/2022 Negative   Final   Comment 02/03/2022 Normal Reference Range Bacterial Vaginosis - Negative   Final   Comment 02/03/2022 Normal Reference Range Candida Species - Negative   Final   Comment 02/03/2022 Normal Reference Range Candida Galbrata - Negative   Final   Comment 02/03/2022 Normal Reference Range Trichomonas - Negative   Final   Comment 02/03/2022 Normal Reference [place] Chlamydia - Negative   Final   Comment 02/03/2022 Normal Reference Range Neisseria Gonorrhea - Negative   Final  Admission on 11/13/2021, Discharged on 11/13/2021  Component Date Value Ref Range Status   Group A Strep by PCR 11/13/2021 DETECTED (A)  NOT DETECTED Final   Performed at [facility], [place] [address], [place], [place] [address]    Allergies: Fish allergy and Shellfish allergy  PTA Medications: (Not in a hospital admission)   Long Term Goals: Improvement  in symptoms so as ready for discharge  Short Term Goals: Patient will verbalize feelings in meetings with treatment team members., Patient will attend at least of 50% of the groups daily., Pt will complete the PHQ9 on admission, day 3 and discharge., Patient will participate in completing the [place], Patient will score a low risk of violence for 24 hours prior to discharge, and Patient will take medications as prescribed daily.  Medical Decision Making  Patient initially presented to the [facility] with SI and increased depression. She was recommended for admission to the [facility] unit. Patient is in agreement.  She is requesting to  be started on an antidepressant.     Recommendations  Based on my evaluation the patient does not appear to have an emergency medical condition.  Admit patient to the [facility].   Meds ordered this encounter  Medications   acetaminophen (TYLENOL) tablet 650 mg Q6H prn   hydrocortisone cream 1 % BID to affected areas    albuterol (VENTOLIN HFA) 108 (90 Base) MCG/ACT inhaler 2 puff q4hours PRN   sertraline (ZOLOFT) tablet 25 mg daily   folic acid (FOLVITE) tablet 1 mg daily    In addition to psychiatric resources. Patient will need to be linked to [facility] GYN services upon discharge.   Revonda Humphrey, NP 04/06/22  5:32 PM

## 2022-04-06 NOTE — Progress Notes (Addendum)
ADDENDUM    04/06/2022    1:13 PM  Jeanette Hall   [id number]   Type of Contact and Topic:  Psychiatric Bed Placement   Pt accepted to  [facility]   Patient to be discharged from ED and provided hospital transport for follow up services.   Abran Richard, RN @ [facility] ED notified via secure chat.     Signed:  Durenda Hurt, MSW, [place], LCAS 04/06/2022 1:14 PM`    [facility] LCSW Progress Note   04/06/2022    11:46 AM  Jeanette Hall   [id number]   Type of Contact and Topic:  Psychiatric Bed Placement     Patient information has been sent to resident physician at [facility] via secure chat to review for potential admission. Patient has not yet been accepted at this time. Patient meets inpatient criteria per [employer], [place] .   Situation ongoing, CSW will continue to monitor and update note as more information becomes available.    Signed:  Durenda Hurt, MSW, LCSWA, LCAS 04/06/2022 11:46 AM

## 2022-04-06 NOTE — ED Provider Notes (Signed)
Emergency Medicine Observation Re-evaluation Note  Jeanette Hall is a 21 y.o. female, seen on rounds today.  Pt initially presented to the ED for complaints of eczema flare up  and Suicidal Currently, the patient is being evaluated for depression  Physical Exam  BP 120/65 (BP Location: Right Arm)   Pulse 79   Temp 99.7 F (37.6 C) (Oral)   Resp 18   LMP 02/07/2022   SpO2 100%  Physical Exam General: wdwn Cardiac: rrr Lungs: no distress sats 100% Psych: depressed Skin with diffuse scaling dry c.w. eczema  ED Course / MDM  EKG:EKG Interpretation  Date/Time:  Sunday April 05 2022 16:45:35 EDT Ventricular Rate:  126 PR Interval:  116 QRS Duration: 62 QT Interval:  290 QTC Calculation: 420 R Axis:   67 Text Interpretation: Sinus tachycardia Otherwise normal ECG When compared with ECG of 01-Apr-2021 06:15, PREVIOUS ECG IS PRESENT Confirmed by Malvin Johns [phone]) on 04/05/2022 11:33:53 PM  I have reviewed the labs performed to date as well as medications administered while in observation.  Recent changes in the last 24 hours include psych dispo.  Plan  Current plan is for psychiatric consultation completion.    Pattricia Boss, MD 04/06/22 1420

## 2022-04-06 NOTE — ED Provider Notes (Signed)
Nurse notify writer that patient was having abdominal pain whenever she moves.  Right to wait to see and assess patient patient stated she has been having leg pain whenever she moves been over or anything like that when asked how long she has been experiencing this patient says been for a while since not new.  Discussed with patient that if it continues to happen to make nurse aware and so will send her out to the hospital for further evaluation.  Nurse notified to monitor patient for any distress.  Patient denies having any spotting or bleeding.

## 2022-04-06 NOTE — Consult Note (Signed)
[facility] ED ASSESSMENT   Reason for Consult:  SI Referring Physician:  Dr. Jeanell Sparrow Patient Identification: Jeanette Hall MRN:  [id number] ED Chief Complaint: Major depressive disorder, recurrent episode, moderate (Jeanette Hall)  Diagnosis:  Principal Problem:   Major depressive disorder, recurrent episode, moderate (Jeanette Hall) Active Problems:   Eczema   ED Assessment Time Calculation: Start Time: 1200 Stop Time: 1230 Total Time in Minutes (Assessment Completion): 30   HPI:   [name] is a 21 y.o. female patient who originally presented to the ED due to severe eczema flareup.  She has not seen a dermatologist in a while, she was using Aquaphor which sometimes helps.  She is not on any current medications for her eczema.  She says she is suicidal and does not want to go on living anymore, patient did have a suicide attempt around a year ago.  Subjective:   Patient seen this morning in [facility] for face-to-face evaluation.  She tells me about her longtime struggles with eczema, and her skin is currently the worst its ever been.  She tells me her depression and occasional suicidal thoughts are directly related to her eczema.  She feels like if she did not have eczema she probably would not have depression.  Today she denies being suicidal.  She states, "I don't want to hurt myself. I don't want to die. I just get so tired of my skin and being in pain. I can't help but to be depressed." SHe denies any homicidal ideations.  Denies any auditory visual hallucinations.  She tells me she previously was started on Prozac over a year ago and feels like it was beneficial, however she never obtain follow-up and was not able to get a prescription.  She is hoping get restarted on medications.  Her urine pregnancy test did result positive yesterday which was a shock to her.  She tells me she is unsure how to feel better right now as she is still in shock.  She is unsure if she would like to terminate the  pregnancy or to continue forward.  I assured her the hospital will provide support and resources for either option.  We spoke about starting Zoloft, which would be a safe option for her pregnancy and she is agreeable to this. We did speak about the risks vs benefits of starting medications while pregnant. She denies any problems with sleep or appetite.  Denies any illicit substance use or alcohol use.  She lives with her mother in [place].  I spoke with Dr. Alfonse Spruce at the facility based crisis at [facility] behavioral urgent care.  They have excepted the patient for admission to the facility based crisis.  I did verify with Dr. Pattricia Boss that the patient is medically clear, and did alert her that there are some open sores with using on her bilateral legs and feet.  Dr. Jeanell Sparrow assured me she was medically cleared with no infection.  Patient can be admitted to the [facility] if her COVID results as negative, and can be moved today.  I did speak to her mom, Jeanette Hall, at [phone].  She is agreeable with the plan and feels like it would benefit the patient to restart medications in a safe and controlled environment.  Jeanette Hall does not know the patient is pregnant, and the patient does not want any staff members to be the ones to tell her.  Patient would like to tell her and her own time.  We will plan on [facility] admission for patient.  She is able to engage in coherent and logical conversation.  No evidence of responding to internal stimuli or psychosis.  Speech is normal in rate and tone.  She does have depressed mood and affect.  She does endorse symptoms of low self-esteem, low energy, anhedonia, random bouts of tearfulness.  Will start Zoloft.  Past Psychiatric History:  Devious history of depression.  1 previous suicide attempt last year, received inpatient psychiatric hospitalization.   Risk to Self or Others: Is the patient at risk to self? No Has the patient been a risk to self in the past 6  months? No Has the patient been a risk to self within the distant past? No Is the patient a risk to others? No Has the patient been a risk to others in the past 6 months? No Has the patient been a risk to others within the distant past? No  [place] Scale:  [facility] from 04/05/2022 in [facility] from 04/07/2021 in [facility] Admission (Discharged) from 03/31/2021 in [facility]  C-SSRS RISK CATEGORY High Risk Error: Q3, 4, or 5 should not be populated when Q2 is No Error: Question 6 not populated       Substance Abuse:  Alcohol / Drug Use Pain Medications: see MAR Prescriptions: see MAR Over the Counter: see MAR History of alcohol / drug use?: No history of alcohol / drug abuse  Past Medical History:  Past Medical History:  Diagnosis Date   Angio-edema    Asthma    Eczema    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    Past Surgical History:  Procedure Laterality Date   MASS EXCISION N/A 05/05/2016   Procedure: EXCISION  OF TONGUE MASS;  Surgeon: Leta Baptist, MD;  Location: [facility];  Service: ENT;  Laterality: N/A;  EXCISION  OF TONGUE MASS   Family History:  Family History  Problem Relation Age of Onset   Asthma Maternal Uncle    Hypertension Maternal Grandmother    Asthma Maternal Grandmother    Cancer Maternal Grandmother    Diabetes Maternal Grandmother    Cancer Paternal Grandmother    Hypertension Mother    Social History:  Social History   Substance and Sexual Activity  Alcohol Use No     Social History   Substance and Sexual Activity  Drug Use Not Currently    Social History   Socioeconomic History   Marital status: Single    Spouse name: Not on file   Number of children: Not on file   Years of education: Not on file   Highest education level: Not on file  Occupational History   Not on file  Tobacco Use   Smoking status: Never   Smokeless  tobacco: Never   Tobacco comments:    stepfather smokes outside  Media planner   Vaping Use: Never used  Substance and Sexual Activity   Alcohol use: No   Drug use: Not Currently   Sexual activity: Yes    Partners: Male    Birth control/protection: None, I.U.D.  Other Topics Concern   Not on file  Social History Narrative   Not on file   Social Determinants of Health   Financial Resource Strain: Not on file  Food Insecurity: Not on file  Transportation Needs: Not on file  Physical Activity: Not on file  Stress: Not on file  Social Connections: Not on file   Additional Social History:    Allergies:   Allergies  Allergen Reactions   Fish Allergy Anaphylaxis   Shellfish Allergy Anaphylaxis    Labs:  Results for orders placed or performed during the hospital encounter of 04/05/22 (from the past 48 hour(s))  Salicylate level     Status: Abnormal   Collection Time: 04/05/22  4:19 PM  Result Value Ref Range   Salicylate Lvl <8.8 (L) 7.0 - 30.0 mg/dL    Comment: Performed at [facility], [place] [address], [place], [place] [address]  Acetaminophen level     Status: Abnormal   Collection Time: 04/05/22  4:19 PM  Result Value Ref Range   Acetaminophen (Tylenol), Serum <10 (L) 10 - 30 ug/mL    Comment: (NOTE) Therapeutic concentrations vary significantly. A range of 10-30 ug/mL  Hall be an effective concentration for many patients. However, some  are best treated at concentrations outside of this range. Acetaminophen concentrations >150 ug/mL at 4 hours after ingestion  and >50 ug/mL at 12 hours after ingestion are often associated with  toxic reactions.  Performed at [facility], [place] [address], [place], [place] [address]   Comprehensive metabolic panel     Status: Abnormal   Collection Time: 04/05/22  4:41 PM  Result Value Ref Range   Sodium 139 135 - 145 mmol/L   Potassium 3.5 3.5 - 5.1 mmol/L   Chloride 106 98 - 111 mmol/L   CO2 20 (L) 22 - 32 mmol/L    Glucose, Bld 155 (H) 70 - 99 mg/dL    Comment: Glucose reference range applies only to samples taken after fasting for at least 8 hours.   BUN 5 (L) 6 - 20 mg/dL   Creatinine, Ser 1.02 (H) 0.44 - 1.00 mg/dL   Calcium 9.3 8.9 - 10.3 mg/dL   Total Protein 6.5 6.5 - 8.1 g/dL   Albumin 3.6 3.5 - 5.0 g/dL   AST 32 15 - 41 U/L   ALT 25 0 - 44 U/L   Alkaline Phosphatase 70 38 - 126 U/L   Total Bilirubin 0.7 0.3 - 1.2 mg/dL   GFR, Estimated >60 >60 mL/min    Comment: (NOTE) Calculated using the CKD-EPI Creatinine Equation (2021)    Anion gap 13 5 - 15    Comment: Performed at [facility] [address] [place], [place] [address]  cbc     Status: Abnormal   Collection Time: 04/05/22  4:41 PM  Result Value Ref Range   WBC 10.0 4.0 - 10.5 K/uL   RBC 4.63 3.87 - 5.11 MIL/uL   Hemoglobin 13.0 12.0 - 15.0 g/dL   HCT 40.8 36.0 - 46.0 %   MCV 88.1 80.0 - 100.0 fL   MCH 28.1 26.0 - 34.0 pg   MCHC 31.9 30.0 - 36.0 g/dL   RDW 14.2 11.5 - 15.5 %   Platelets 449 (H) 150 - 400 K/uL   nRBC 0.0 0.0 - 0.2 %    Comment: Performed at [facility] [address] [place], [place] [address]  Ethanol     Status: None   Collection Time: 04/05/22  4:41 PM  Result Value Ref Range   Alcohol, Ethyl (B) <10 <10 mg/dL    Comment: (NOTE) Lowest detectable limit for serum alcohol is 10 mg/dL.  For medical purposes only. Performed at [facility], [place] [address] [place], [place] [address]  I-Stat beta hCG blood, ED     Status: Abnormal   Collection Time: 04/05/22  4:57 PM  Result Value Ref Range   I-stat hCG, quantitative 91.1 (H) <5 mIU/mL   Comment 3            Comment:   GEST. AGE      CONC.  (mIU/mL)   <=1 WEEK        5 - 50     2 WEEKS       50 - 500     3 WEEKS       100 - 10,000     4 WEEKS     1,000 - 30,000        FEMALE AND NON-PREGNANT FEMALE:     LESS THAN 5 mIU/mL   hCG, serum, qualitative     Status: Abnormal   Collection Time: 04/05/22 10:10 PM  Result Value Ref  Range   Preg, Serum POSITIVE (A) NEGATIVE    Comment:        THE SENSITIVITY OF THIS METHODOLOGY IS >10 mIU/mL. Performed at [facility], [place] [address], [place], [place] [address]     Current Facility-Administered Medications  Medication Dose Route Frequency Provider Last Rate Last Admin   acetaminophen (TYLENOL) tablet 650 mg  650 mg Oral Q6H PRN Malvin Johns, MD   650 mg at 04/06/22 0017   albuterol (VENTOLIN HFA) 108 (90 Base) MCG/ACT inhaler 2 puff  2 puff Inhalation Q2H PRN Malvin Johns, MD   2 puff at 04/05/22 1650   hydrocortisone cream 1 %   Topical BID Redwine, Madison A, PA-C       [START ON 04/07/2022] sertraline (ZOLOFT) tablet 25 mg  25 mg Oral Daily Vesta Mixer, NP       Current Outpatient Medications  Medication Sig Dispense Refill   FLUoxetine (PROZAC) 10 MG capsule Take 1 capsule (10 mg total) by mouth daily. (Patient not taking: Reported on 02/09/2022)  3   hydrocerin (EUCERIN) CREA Apply 1 application topically 2 (two) times daily. (Patient not taking: Reported on 04/06/2022)  0   hydrocortisone cream 1 % Apply topically 2 (two) times daily. (Patient not taking: Reported on 04/06/2022) 30 g 0   hydrOXYzine (ATARAX/VISTARIL) 25 MG tablet Take 1 tablet (25 mg total) by mouth 3 (three) times daily as needed for anxiety. (Patient not taking: Reported on 02/09/2022) 30 tablet 0   Levonorgestrel (LILETTA, 52 MG, IU) 52 mg by Intrauterine route continuous. Inserted 09/01/2018 (Patient not taking: Reported on 04/06/2022)     melatonin 5 MG TABS Take 1 tablet (5 mg total) by mouth at bedtime. (Patient not taking: Reported on 04/06/2022) 30 tablet 0   metroNIDAZOLE (FLAGYL) 500 MG tablet Take 1 tablet (500 mg total) by mouth 2 (two) times daily. (Patient not taking: Reported on 04/06/2022) 14 tablet 0   triamcinolone ointment (KENALOG) 0.5 % Apply 1 Application topically 2 (two) times daily. (Patient not taking: Reported on 04/06/2022) 30 g 3   Psychiatric Specialty  Exam: Presentation  General Appearance: Appropriate for Environment  Eye Contact:Good  Speech:Clear and Coherent  Speech Volume:Normal  Handedness:No data recorded  Mood and Affect  Mood:Depressed  Affect:Congruent   Thought Process  Thought Processes:Coherent  Descriptions of Associations:Intact  Orientation:Full (Time, Place and Person)  Thought Content:Logical  History of Schizophrenia/Schizoaffective disorder:No data recorded Duration of Psychotic Symptoms:No data recorded Hallucinations:Hallucinations: None  Ideas of Reference:None  Suicidal Thoughts:Suicidal Thoughts: No  Homicidal Thoughts:Homicidal Thoughts: No  Sensorium  Memory:Immediate Fair; Recent Fair  Judgment:Fair  Insight:Fair   Executive Functions  Concentration:Good  Attention Span:Good  [place] of Knowledge:Good  Language:Good   Psychomotor Activity  Psychomotor Activity:Psychomotor Activity: Normal   Assets  Assets:Communication Skills; Desire for Improvement; Physical Health; Resilience; Social Support    Sleep  Sleep:Sleep: Fair   Physical Exam: Physical Exam Neurological:     Mental Status: She is alert and oriented to person, place, and time.  Psychiatric:        Mood and Affect: Mood is depressed.        Behavior: Behavior is cooperative.        Thought Content: Thought content normal.    Review of Systems  Skin:        Severe eczema   Psychiatric/Behavioral:  Positive for depression.   All other systems reviewed and are negative.  Blood pressure 120/65, pulse 79, temperature 99.7 F (37.6 C), temperature source Oral, resp. rate 18, last menstrual period 02/07/2022, SpO2 100 %. There is no height or weight on file to calculate BMI.  Medical Decision Making: Patient case reviewed and discussed with Dr. Dwyane Dee.  Patient is agreeable to voluntary admission at [facility].  She does not meet criteria for IVC.  She is agreeable to starting Zoloft trial.   Patient is psychiatrically cleared to discharge from the ED and present for admission at [facility].   Disposition: No evidence of imminent risk to self or others at present.   Patient does not meet criteria for psychiatric inpatient admission. Supportive therapy provided about ongoing stressors. Discussed crisis plan, support from social network, calling 911, coming to the Emergency Department, and calling Suicide Hotline.  Vesta Mixer, NP 04/06/2022 1:09 PM

## 2022-04-06 NOTE — ED Notes (Signed)
Snacks given 

## 2022-04-06 NOTE — Discharge Instructions (Signed)
Please follow-up as referred for pregnancy Please go as planned to the behavioral health urgent care for treatment  Use Aquaphor as needed for your skin trying to avoid any drying
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
## 2022-04-06 NOTE — ED Notes (Signed)
Pt sent from [facility] for assessment for [facility] unit. Upon arrival to unit, pt denies SI/HI/AVH. Pt states, "I never said I was suicidal. I just feel so sad, depressed, hopeless and embarrassed of my skin condition. I hurt all the time even trying to take a shower hurt. The water hurt and I'm tired living like this (tearful)". Writer observed severe dry skin on entire body with slight weeping from top of bil feet. Skin between 2nd and 3rd toes on R foot are emaciated and tender to touch. Writer, with the assist of another nurse, removed pt socks to assess area. Pt grimace as we were removing socks. MD notified immediately to assess skin. Pt have built up flaking and dryness all over face, head and entire body. Pt, impulsively, cut all hair from top of her head. Observed severe dry scalp. Pt have a body odor. MD discussed itch relief medications and creams to promote comfort and referral to Dermatology upon discharge. Pt very appreciative. Pt sitting up in room with a pitcher of water on pt stand, per pt request. Meal offered and eaten. Informed pt to notify staff with any needs. Pt provided clean scrubs and socks. Will monitor for safety.

## 2022-04-07 ENCOUNTER — Encounter (HOSPITAL_COMMUNITY): Payer: Self-pay

## 2022-04-07 ENCOUNTER — Encounter (HOSPITAL_COMMUNITY): Payer: Self-pay | Admitting: Psychiatry

## 2022-04-07 DIAGNOSIS — O99341 Other mental disorders complicating pregnancy, first trimester: Secondary | ICD-10-CM | POA: Diagnosis not present

## 2022-04-07 DIAGNOSIS — O99711 Diseases of the skin and subcutaneous tissue complicating pregnancy, first trimester: Secondary | ICD-10-CM | POA: Diagnosis not present

## 2022-04-07 DIAGNOSIS — L309 Dermatitis, unspecified: Secondary | ICD-10-CM | POA: Diagnosis not present

## 2022-04-07 DIAGNOSIS — Z3490 Encounter for supervision of normal pregnancy, unspecified, unspecified trimester: Secondary | ICD-10-CM

## 2022-04-07 DIAGNOSIS — F332 Major depressive disorder, recurrent severe without psychotic features: Secondary | ICD-10-CM | POA: Diagnosis not present

## 2022-04-07 MED ORDER — MUPIROCIN CALCIUM 2 % EX CREA: 1.0000 | TOPICAL_CREAM | Freq: Three times a day (TID) | CUTANEOUS | 0 refills | Status: AC

## 2022-04-07 MED ORDER — ALBUTEROL SULFATE HFA 108 (90 BASE) MCG/ACT IN AERS: 2.0000 | INHALATION_SPRAY | RESPIRATORY_TRACT | 0 refills | Status: DC | PRN

## 2022-04-07 MED ORDER — PRENATAL PLUS VITAMIN/MINERAL 27-1 MG PO TABS
1.0000 | ORAL_TABLET | Freq: Every day | ORAL | Status: DC
  Administered 2022-04-07: 1 via ORAL
  Filled 2022-04-07: qty 7
  Filled 2022-04-07: qty 1

## 2022-04-07 MED ORDER — PRENATAL PLUS VITAMIN/MINERAL 27-1 MG PO TABS: 1.0000 | ORAL_TABLET | Freq: Every day | ORAL | Status: DC

## 2022-04-07 MED ORDER — HYDROCERIN EX CREA: 1.0000 | TOPICAL_CREAM | Freq: Three times a day (TID) | CUTANEOUS | 0 refills | Status: AC

## 2022-04-07 MED ORDER — SERTRALINE HCL 25 MG PO TABS: 25.0000 mg | ORAL_TABLET | Freq: Every day | ORAL | 0 refills | Status: DC

## 2022-04-07 MED ORDER — FOLIC ACID 1 MG PO TABS: 1.0000 mg | ORAL_TABLET | Freq: Every day | ORAL | 0 refills | Status: DC

## 2022-04-07 MED ORDER — PRENATAL PLUS 27-1 MG PO TABS
1.0000 | ORAL_TABLET | Freq: Every day | ORAL | Status: DC
  Administered 2022-04-08: 1 via ORAL

## 2022-04-07 MED ORDER — PROMETHAZINE HCL 25 MG PO TABS
12.5000 mg | ORAL_TABLET | Freq: Once | ORAL | Status: AC
  Administered 2022-04-07: 12.5 mg via ORAL
  Filled 2022-04-07: qty 1

## 2022-04-07 MED ORDER — AQUAPHOR EX OINT: TOPICAL_OINTMENT | Freq: Two times a day (BID) | CUTANEOUS | 0 refills | Status: DC

## 2022-04-07 MED FILL — Prenatal Vit w/ Fe Fumarate-FA Tab 27-1 MG: ORAL | Qty: 1 | Status: AC

## 2022-04-07 NOTE — ED Notes (Signed)
Pt is in the bed sleeping. Respirations are even and unlabored. No acute distress noted. Will continue to monitor for safety. 

## 2022-04-07 NOTE — BH IP Treatment Plan (Signed)
Interdisciplinary Treatment and Diagnostic Plan Update  04/07/2022 Time of Session: [place] MRN: [id number]  Diagnosis:  Final diagnoses:  Severe episode of recurrent major depressive disorder, without psychotic features ([place])     Current Medications:  Current Facility-Administered Medications  Medication Dose Route Frequency Provider Last Rate Last Admin   acetaminophen (TYLENOL) tablet 650 mg  650 mg Oral Q6H PRN Revonda Humphrey, NP       albuterol (VENTOLIN HFA) 108 (90 Base) MCG/ACT inhaler 2 puff  2 puff Inhalation Q4H PRN Revonda Humphrey, NP       calamine lotion 1 Application  1 Application Topical TID Merrily Brittle, DO   1 Application at 46/56/81 2751   colloidal oatmeal (AVEENO) medicated soap bar   Topical Daily Merrily Brittle, DO       feeding supplement (ENSURE ENLIVE / ENSURE PLUS) liquid 237 mL  237 mL Oral Q24H Merrily Brittle, DO   237 mL at 04/06/22 2046   hydrocerin (EUCERIN) cream 1 Application  1 Application Topical BID Merrily Brittle, DO   1 Application at 70/01/74 9449   mineral oil-hydrophilic petrolatum (AQUAPHOR) ointment   Topical BID Merrily Brittle, DO   Given at 04/07/22 0919   mupirocin cream (BACTROBAN) 2 % 1 Application  1 Application Topical TID Merrily Brittle, DO   1 Application at 67/59/16 0919   Prenatal Plus Vitamin/Mineral 27-1 MG TABS 1 tablet  1 tablet Oral Q0600 Minda Ditto, RPH   1 tablet at 04/07/22 1300   sertraline (ZOLOFT) tablet 25 mg  25 mg Oral Daily Revonda Humphrey, NP   25 mg at 04/07/22 3846   Current Outpatient Medications  Medication Sig Dispense Refill   albuterol (VENTOLIN HFA) 108 (90 Base) MCG/ACT inhaler Inhale 2 puffs into the lungs every 4 (four) hours as needed for wheezing or shortness of breath. Wheezing or shortness of breath 6.7 g 0   [START ON 6/59/9357] folic acid (FOLVITE) 1 MG tablet Take 1 tablet (1 mg total) by mouth daily. 30 tablet 0   hydrocerin (EUCERIN) CREA Apply 1 Application topically 3  (three) times daily. 454 g 0   mineral oil-hydrophilic petrolatum (AQUAPHOR) ointment Apply topically 2 (two) times daily. 420 g 0   mupirocin cream (BACTROBAN) 2 % Apply 1 Application topically 3 (three) times daily for 5 days. Apply to affected area for 5 days, if skin is still oozing/wheeping, please see your PCP 15 g 0   Prenatal Vit-Fe Fumarate-FA (PRENATAL PLUS VITAMIN/MINERAL) 27-1 MG TABS Take 1 tablet by mouth daily at 6 (six) AM.     [START ON 04/08/2022] sertraline (ZOLOFT) 25 MG tablet Take 1 tablet (25 mg total) by mouth daily. 30 tablet 0   PTA Medications: Prior to Admission medications   Medication Sig Start Date End Date Taking? Authorizing Provider  albuterol (VENTOLIN HFA) 108 (90 Base) MCG/ACT inhaler Inhale 2 puffs into the lungs every 4 (four) hours as needed for wheezing or shortness of breath. Wheezing or shortness of breath 04/07/22 05/07/22  Merrily Brittle, DO  folic acid (FOLVITE) 1 MG tablet Take 1 tablet (1 mg total) by mouth daily. 04/08/22 05/08/22  Merrily Brittle, DO  hydrocerin (EUCERIN) CREA Apply 1 Application topically 3 (three) times daily. 04/07/22   Merrily Brittle, DO  mineral oil-hydrophilic petrolatum (AQUAPHOR) ointment Apply topically 2 (two) times daily. 04/07/22   Merrily Brittle, DO  mupirocin cream (BACTROBAN) 2 % Apply 1 Application topically 3 (three) times daily for 5 days. Apply  to affected area for 5 days, if skin is still oozing/wheeping, please see your PCP 04/07/22 04/12/22  Merrily Brittle, DO  Prenatal Vit-Fe Fumarate-FA (PRENATAL PLUS VITAMIN/MINERAL) 27-1 MG TABS Take 1 tablet by mouth daily at 6 (six) AM. 04/07/22   Merrily Brittle, DO  sertraline (ZOLOFT) 25 MG tablet Take 1 tablet (25 mg total) by mouth daily. 04/08/22 05/08/22  Merrily Brittle, DO  cetirizine (ZYRTEC) 10 MG tablet Take 1 tablet (10 mg total) by mouth daily. Patient not taking: Reported on 03/21/2019 12/31/17 05/30/19  Kennith Gain, MD  fluticasone [facility]) 50 MCG/ACT nasal spray  Place 2 sprays into both nostrils daily. Patient not taking: Reported on 03/21/2019 11/11/17 05/30/19  Morene Crocker, CNM    Patient Stressors: Financial difficulties   Health problems   Medication change or noncompliance    Patient Strengths: Ability for insight  Active sense of humor  Average or above average intelligence  Capable of independent living  Communication skills  General fund of knowledge  Motivation for treatment/growth  Supportive family/friends  Work skills   Treatment Modalities: Medication Management, Group therapy, Case management,  1 to 1 session with clinician, Psychoeducation, Recreational therapy.   Physician Treatment Plan for Primary and Secondary Diagnosis:  Final diagnoses:  Severe episode of recurrent major depressive disorder, without psychotic features ([place])   Long Term Goal(s): Improvement in symptoms so as ready for discharge  Short Term Goals: Patient will verbalize feelings in meetings with treatment team members. Patient will attend at least of 50% of the groups daily. Pt will complete the PHQ9 on admission, day 3 and discharge. Patient will participate in completing the [place] Patient will score a low risk of violence for 24 hours prior to discharge Patient will take medications as prescribed daily.  Medication Management: Evaluate patient's response, side effects, and tolerance of medication regimen.  Therapeutic Interventions: 1 to 1 sessions, Unit Group sessions and Medication administration.  Evaluation of Outcomes: Progressing  LCSW Treatment Plan for Primary Diagnosis:  Final diagnoses:  Severe episode of recurrent major depressive disorder, without psychotic features ([place])    Long Term Goal(s): Safe transition to appropriate next level of care at discharge.  Short Term Goals: Facilitate acceptance of mental health diagnosis and concerns through verbal commitment to aftercare plan and  appointments at discharge., Patient will identify one social support prior to discharge to aid in patient's recovery., Identify minimum of 2 triggers associated with mental health/substance abuse issues with treatment team members., and Increase skills for wellness and recovery by attending 50% of scheduled groups.  Therapeutic Interventions: Assess for all discharge needs, 1 to 1 time with Education officer, museum, Explore available resources and support systems, Assess for adequacy in community support network, Educate family and significant other(s) on suicide prevention, Complete Psychosocial Assessment, Interpersonal group therapy.  Evaluation of Outcomes: Progressing   Progress in Treatment: Attending groups: No. and As evidenced by:  Just arrived on [facility]. Participating in groups: No. and As evidenced by:  Just arrived on [facility]. Taking medication as prescribed: Yes. Toleration medication: Yes. Family/Significant other contact made: Yes, individual(s) contacted:  States she  Patient understands diagnosis: Yes. Discussing patient identified problems/goals with staff: Yes. Medical problems stabilized or resolved: No. Denies suicidal/homicidal ideation: Yes. Issues/concerns per patient self-inventory: Yes. Other: In need of dermatology to treat her skin condition.  New problem(s) identified: Yes, Describe:  In need of a primary care provider and dermatology specialist.  New Short Term/Long Term Goal(s):  To  get a job and move out of her mother's home.  Patient Goals:  To be stabilized on medications and maintain consistency with medication management.  Prefers outpatient resources for therapy and medication management.  Discharge Plan or Barriers:   Reason for Continuation of Hospitalization: Depression Medical Issues Medication stabilization  Estimated Length of Stay:  Last 3 [place] Suicide Severity Risk Score: [place] ED from 04/06/2022 in [facility] ED  from 04/05/2022 in [place] ED from 04/07/2021 in [facility] [place] No Risk High Risk Error: Q3, 4, or 5 should not be populated when Q2 is No       Last PHQ 2/9 Scores:    04/06/2022    5:30 PM  Depression screen PHQ 2/9  Decreased Interest 1  Down, Depressed, Hopeless 3  PHQ - 2 Score 4  Altered sleeping 2  Tired, decreased energy 2  Change in appetite 2  Feeling bad or failure about yourself  2  Trouble concentrating 0  Moving slowly or fidgety/restless 0  Suicidal thoughts 1  PHQ-9 Score 13  Difficult doing work/chores Very difficult    Scribe for Treatment Team: Kerri Perches, LCSW 04/07/2022 1:35 PM

## 2022-04-07 NOTE — ED Notes (Signed)
Calm, quiet and without issue.  Patient has severe eczema throughout body and has creams ordered to apply to affected areas.  No reports of vaginal bleeding or abdominal cramping.

## 2022-04-07 NOTE — Progress Notes (Signed)
Patient was awake for most of the morning watching tv.  She ate lunch and has now gone back to her room and is resting.  No complaints or distress.  She is scheduled for discharge tomorrow morning.

## 2022-04-07 NOTE — Discharge Instructions (Addendum)
Dear Jeanette Hall,  Most effective treatment for your mental health disease involves BOTH a psychiatrist AND a therapist Psychiatrist to manage medications Therapist to help identify personal goals, barriers from those goals, and plan to achieve those goals by understanding emotions Please make regular appointments with an outpatient psychiatrist and other doctors once you leave the hospital (if any, otherwise, please see below for resources to make an appointment).  For therapy outside the hospital, please ask for these specific types of therapy: DBT ________________________________________________________  SAFETY CRISIS  Dial 988 for [place]    Text [phone] for Crisis Text Line:     [facility]:  [address] FIRST FLOOR.  [place], [place] [address].  [phone]  Mobile Crisis Response Teams Listed by counties in vicinity of [place]. [phone] [place] [phone] [place] [phone] [facility] [phone] [place] [phone] [place]. [phone] [place].  [place] [phone] ________________________________________________________  To see which pharmacy near you is the CHEAPEST for certain medications, please use GoodRx. It is free website and has a free phone app.    Also consider looking at [facility] $4.00 or [facility]'s $7.00 prescription list. Both are free to view if googled "[facility] $4 prescription" and "public's $7 prescription". These are set prices, no insurance required. [facility] low cost medications: $4-$15 for 30days  prescriptions or $10-$38 for 90days prescriptions  ________________________________________________________  Difficulties with sleep?   Can also use this free app for insomnia called CBT-I. Let your doctors and therapists know so they can help with extra tips and tricks or for guidance and accountability. NO ADDS on the app.     ________________________________________________________  Non-Emergent / Urgent  [facility] [address], [place], [place] [address] [phone] OUTPATIENT Walk-in information: Please note, all walk-ins are first come & first serve, with limited number of availability.  Please note that to be eligible for services you must bring: ID or a piece of mail with your name [facility] address  Therapist for therapy:  Monday & Wednesdays: Please ARRIVE at 7:15 AM for registration Will START at 8:00 AM Every 1st & 2nd Friday of the month: Please ARRIVE at 10:15 AM for registration Will START at 1 PM - 5 PM  Psychiatrist for medication management: Monday - Friday:  Please ARRIVE at 7:15 AM for registration Will START at 8:00 AM  Regretfully, due to limited availability, please be aware that you may not been seen on the same day as walk-in. Please consider making an appoint or try again. Thank you for your patience and understanding.    Based on what you have shared, a list of resources for outpatient therapy and psychiatry is provided below to get you started back on treatment.  It is imperative that you follow through with treatment within 5-7 days from the day of discharge to prevent any further risk to your safety or mental well-being.  You are not limited to the list provided.  In case of an urgent crisis, you may contact the Mobile Crisis Unit with [facility] at [phone].  Outpatient Services for Therapy and Medication Management for Medicaid  [facility] [address] [place], [place], [address] [phone] phone  [place] [address] [place], [place], [address] [phone] phone ([address] [place], [place], [place], [place], [employer], [place], [place], [place], [place], [place], [place], [employer], [employer], [employer], [place])  [facility] [address], [place], [place], [address] [phone] phone  [facility] [address], [place], [place], [address] [phone] phone  [facility] [address], [place], [place], [address] [phone] phone  [facility] [address], [place], [address] [phone] phone  [facility] [place] [address], [place], [address] [phone] phone  [facility] [place] [place], [place], [address] [phone] phone [phone] fax  [facility] [address] [place], [place], [address] [phone] phone [phone] fax  [facility] [phone] [place], [place], [address] [phone] phone  [facility] [address] [place], [place], [address]  [phone] phone  [facility]  (Adults Only) [address] [place], [place], [address]  [phone] phone [phone] fax
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
## 2022-04-07 NOTE — ED Notes (Signed)
Provider came to see patient.

## 2022-04-07 NOTE — ED Notes (Signed)
Pt complained of abdominal pain and requested to see provider. Provider notified

## 2022-04-07 NOTE — ED Notes (Signed)
Pt. Pleasant,denies SI/HI. In dayroom and talking on phone. No noted distress. Will continue to monitor for safety.She voices c/o nausea. Provider notified. See MAR.
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
## 2022-04-08 ENCOUNTER — Other Ambulatory Visit: Payer: Self-pay | Admitting: *Deleted

## 2022-04-08 ENCOUNTER — Other Ambulatory Visit: Payer: Medicaid Other

## 2022-04-08 ENCOUNTER — Institutional Professional Consult (permissible substitution): Payer: Medicaid Other | Admitting: Licensed Clinical Social Worker

## 2022-04-08 DIAGNOSIS — L309 Dermatitis, unspecified: Secondary | ICD-10-CM | POA: Diagnosis not present

## 2022-04-08 DIAGNOSIS — Z3201 Encounter for pregnancy test, result positive: Secondary | ICD-10-CM

## 2022-04-08 DIAGNOSIS — O99341 Other mental disorders complicating pregnancy, first trimester: Secondary | ICD-10-CM | POA: Diagnosis not present

## 2022-04-08 DIAGNOSIS — O99711 Diseases of the skin and subcutaneous tissue complicating pregnancy, first trimester: Secondary | ICD-10-CM | POA: Diagnosis not present

## 2022-04-08 DIAGNOSIS — F332 Major depressive disorder, recurrent severe without psychotic features: Secondary | ICD-10-CM | POA: Diagnosis not present

## 2022-04-08 NOTE — ED Notes (Signed)
Patient in her bed sleeping. Patient respirations are even and unlabored. Will continue to monitor for safety.

## 2022-04-09 LAB — BETA HCG QUANT (REF LAB): hCG Quant: 438 m[IU]/mL

## 2022-04-13 ENCOUNTER — Other Ambulatory Visit: Payer: Self-pay | Admitting: *Deleted

## 2022-04-13 ENCOUNTER — Encounter: Payer: Self-pay | Admitting: *Deleted

## 2022-04-13 ENCOUNTER — Telehealth: Payer: Self-pay | Admitting: *Deleted

## 2022-04-13 NOTE — Telephone Encounter (Signed)
TC from pt regarding bHCG results and next steps. Dr. Elly Modena was consulted. Plan for f/u as scheduled on 04/29/22 for OB Intake/ [place] viability and dating.  Pt advised to seek care in [facility]U immediately for vaginal bleeding or abdominal pain, esp one sided. Pt verbalized understanding.
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
## 2022-04-21 NOTE — Patient Instructions (Signed)
Thank you for attending your appointment today.  -- RESTART Zoloft 25 mg daily  -- Here is a Designer, television/film set regarding use of Zoloft in pregnancy: [email or web address] -- Continue daily folic acid and start on full prenatal vitamin as soon as you're able -- Continue other medications as prescribed.  Please do not make any changes to medications without first discussing with your provider. If you are experiencing a psychiatric emergency, please call 911 or present to your nearest emergency department. Additional crisis, medication management, and therapy resources are included below.  [facility]  [address], [place], [place] [address] [phone] WALK-IN URGENT CARE 24/7 FOR ANYONE [address] [place], [place]  [phone] Fax: [phone] [email or web address] *Interpreters available *Accepts all insurance and uninsured for Urgent Care needs *Accepts Medicaid and uninsured for outpatient treatment (below)      ONLY FOR [facility]  Below:    Outpatient New Patient Assessment/Therapy Walk-ins:        Monday -Thursday 8am until slots are full.        Every Friday 1pm-4pm  (first come, first served)                   New Patient Psychiatry/Medication Management        Monday-Friday 8am-11am (first come, first served)               For all walk-ins we ask that you arrive by 7:15am, because patients will be seen in the order of arrival.

## 2022-04-22 ENCOUNTER — Ambulatory Visit (INDEPENDENT_AMBULATORY_CARE_PROVIDER_SITE_OTHER): Payer: Medicaid Other | Admitting: Psychiatry

## 2022-04-22 ENCOUNTER — Encounter (HOSPITAL_COMMUNITY): Payer: Self-pay | Admitting: Psychiatry

## 2022-04-22 DIAGNOSIS — Z8659 Personal history of other mental and behavioral disorders: Secondary | ICD-10-CM

## 2022-04-22 DIAGNOSIS — F331 Major depressive disorder, recurrent, moderate: Secondary | ICD-10-CM

## 2022-04-22 DIAGNOSIS — Z3A01 Less than 8 weeks gestation of pregnancy: Secondary | ICD-10-CM | POA: Diagnosis not present

## 2022-04-22 DIAGNOSIS — O99891 Other specified diseases and conditions complicating pregnancy: Secondary | ICD-10-CM | POA: Insufficient documentation

## 2022-04-22 DIAGNOSIS — O99341 Other mental disorders complicating pregnancy, first trimester: Secondary | ICD-10-CM

## 2022-04-22 HISTORY — DX: Personal history of other mental and behavioral disorders: Z86.59

## 2022-04-22 MED ORDER — SERTRALINE HCL 25 MG PO TABS: 25.0000 mg | ORAL_TABLET | Freq: Every day | ORAL | 2 refills | Status: DC

## 2022-04-29 ENCOUNTER — Encounter: Payer: Self-pay | Admitting: Obstetrics

## 2022-04-29 ENCOUNTER — Ambulatory Visit (INDEPENDENT_AMBULATORY_CARE_PROVIDER_SITE_OTHER): Payer: Medicaid Other

## 2022-04-29 ENCOUNTER — Ambulatory Visit: Payer: Medicaid Other | Admitting: *Deleted

## 2022-04-29 VITALS — BP 132/78 | HR 78 | Ht 64.5 in | Wt 138.0 lb

## 2022-04-29 DIAGNOSIS — O3680X0 Pregnancy with inconclusive fetal viability, not applicable or unspecified: Secondary | ICD-10-CM

## 2022-04-29 DIAGNOSIS — Z3480 Encounter for supervision of other normal pregnancy, unspecified trimester: Secondary | ICD-10-CM | POA: Insufficient documentation

## 2022-04-29 DIAGNOSIS — L309 Dermatitis, unspecified: Secondary | ICD-10-CM

## 2022-04-29 MED ORDER — BLOOD PRESSURE KIT DEVI: 1.0000 | 0 refills | Status: DC

## 2022-04-29 MED ORDER — PRENATAL 28-0.8 MG PO TABS: 1.0000 | ORAL_TABLET | Freq: Every day | ORAL | 12 refills | Status: DC

## 2022-04-29 NOTE — Progress Notes (Signed)
New OB Intake  I connected with  Jeanette Hall on 04/29/22 at  1:10 PM EDT by In Person Visit and verified that I am speaking with the correct person using two identifiers. Nurse is located at [facility] and pt is located at [place].  I discussed the limitations, risks, security and privacy concerns of performing an evaluation and management service by telephone and the availability of in person appointments. I also discussed with the patient that there may be a patient responsible charge related to this service. The patient expressed understanding and agreed to proceed.  I explained I am completing New OB Intake today. We discussed her EDD of 12/13/22 that is based on [place] today at 101w3d Pt is G2/P1. I reviewed her allergies, medications, Medical/Surgical/OB history, and appropriate screenings. I informed [facility] of [facility]. [facility]. Based on history, this is a/an  pregnancy uncomplicated .   Patient Active Problem List   Diagnosis Date Noted   History of postpartum depression, currently pregnant 04/22/2022   Currently pregnant 04/07/2022   Major depressive disorder, recurrent episode, moderate (HStrawn 04/06/2022   Anxiety disorder, unspecified 04/02/2021   Constipation 04/02/2021   Insomnia 04/02/2021   History of suicide attempt 03/28/2021   Eczema 03/28/2021   Asthma 03/28/2021   Vitamin D deficiency 08/27/2017   Hx of tonsillectomy 08/19/2017    Concerns addressed today  [facility] : [facility] for more information [place] Patient is not interested in water birth. Offered upcoming OB visit with CNM to discuss further.  MyChart/Babyscripts MyChart access verified. I explained pt will have some visits in office and some virtually. Babyscripts instructions given and order placed. Patient verifies receipt of registration text/e-mail. Account successfully created  and app downloaded.  Blood Pressure Cuff/Weight Scale Blood pressure cuff ordered for patient to pick-up from [employer] Explained after first prenatal appt pt will check weekly and document in B56 Patient does / does not  have weight scale. Weight scale ordered for patient to pick up from [employer]   Anatomy [place]Explained first scheduled [place]ill be around 19 weeks. Anatomy [place]ascheduled for 19 wks at [facility]. Pt notified to arrive at TBD.  Labs Discussed NJohnsie Cancelgenetic screening with patient. Would like both Panorama and Horizon drawn at new OB visit. Routine prenatal labs needed.  Covid Vaccine Patient has covid vaccine.  Social Determinants of Health Food Insecurity: Patient expresses food insecurity. Food Market information given to patient; explained patient may visit at the end of first OB appointment. WIC Referral: [facility]  Transportation: Patient denies transportation needs. Childcare: Discussed no children allowed at ultrasound appointments. Offered childcare services; patient declines childcare services at this time.  First visit review I reviewed new OB appt with pt. I explained she will have a provider visit that includes labs and pelvic exam. Explained pt will be seen by Dr. HJodi Mourningat first visit; encounter routed to appropriate provider. Explained that patient will be seen by pregnancy navigator following visit with provider.   EPenny Pia RN 04/29/2022  1:28 PM

## 2022-05-07 ENCOUNTER — Ambulatory Visit (INDEPENDENT_AMBULATORY_CARE_PROVIDER_SITE_OTHER): Payer: Medicaid Other | Admitting: Licensed Clinical Social Worker

## 2022-05-07 DIAGNOSIS — F331 Major depressive disorder, recurrent, moderate: Secondary | ICD-10-CM | POA: Diagnosis not present

## 2022-05-07 NOTE — Progress Notes (Signed)
Virtual Visit via Video Note  I connected with [name] on 05/07/22 at  9:00 AM EDT by a video enabled telemedicine application and verified that I am speaking with the correct person using two identifiers.  Location: Patient: Home residence Provider: Virtual office   I discussed the limitations of evaluation and management by telemedicine and the availability of in person appointments. The patient expressed understanding and agreed to proceed.  History of Present Illness: Shandy reports that she has been feeling depressed for the last two years is when she started to notice. Reports that since discharging from urgent care she has been stable in mood and medication have been helpful with symptoms. She reports that she was able to have her follow up with the provider and discussed a medication increase. She reports that would like to increase as she is still experiencing some negative symptoms.   Observations/Objective: Shawnee reports that she wants to have therapy to practice being open about how she feels and being able to talk about it openly. She identifies her primary supports as her mother, sister and her child's father. Kellie reports that she lives in the home with her mother, son and she has two brothers. She reports that she has been coping by taking medication and sleeping, she reports that she enjoys fidget type toys that she can use throughout the day. She rates 0 on depression screening taken on 04/29/2022. She denies any pain presently and reports that due to pregnancy her appetite has been up and down and she tries to eat the foods that she is craving more and is able to keep food down after eating. Najiyah denies SI/HI, AVH or psychosis.  Assessment and Plan: Chemere reports that she would like to meet bi-weekly for sessions.  Follow Up Instructions: Charlet will meet on 11/09 at [place].   I discussed the assessment and treatment plan with the patient. The patient was provided  an opportunity to ask questions and all were answered. The patient agreed with the plan and demonstrated an understanding of the instructions.   The patient was advised to call back or seek an in-person evaluation if the symptoms worsen or if the condition fails to improve as anticipated.  I provided 20 minutes of non-face-to-face time during this encounter.   Allyson Sabal, [facility]
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
## 2022-05-19 NOTE — Patient Instructions (Signed)
Thank you for attending your appointment today.  -- We did not make any medication changes today. Please continue medications as prescribed.  Please do not make any changes to medications without first discussing with your provider. If you are experiencing a psychiatric emergency, please call 911 or present to your nearest emergency department. Additional crisis, medication management, and therapy resources are included below.  [facility]  [address], [place], [place] [address] [phone] WALK-IN URGENT CARE 24/7 FOR ANYONE [address], [place], [place]  [phone] Fax: [phone] [email or web address] *Interpreters available *Accepts all insurance and uninsured for Urgent Care needs *Accepts Medicaid and uninsured for outpatient treatment (below)      ONLY FOR [place] Residents  Below:    Outpatient New Patient Assessment/Therapy Walk-ins:        Monday -Thursday 8am until slots are full.        Every Friday 1pm-4pm  (first come, first served)                   New Patient Psychiatry/Medication Management        Monday-Friday 8am-11am (first come, first served)               For all walk-ins we ask that you arrive by 7:15am, because patients will be seen in the order of arrival.   

## 2022-05-20 ENCOUNTER — Encounter (HOSPITAL_COMMUNITY): Payer: Self-pay | Admitting: Psychiatry

## 2022-05-20 ENCOUNTER — Telehealth (INDEPENDENT_AMBULATORY_CARE_PROVIDER_SITE_OTHER): Payer: Medicaid Other | Admitting: Psychiatry

## 2022-05-20 DIAGNOSIS — F331 Major depressive disorder, recurrent, moderate: Secondary | ICD-10-CM

## 2022-05-20 DIAGNOSIS — Z8659 Personal history of other mental and behavioral disorders: Secondary | ICD-10-CM | POA: Diagnosis not present

## 2022-05-20 DIAGNOSIS — O99891 Other specified diseases and conditions complicating pregnancy: Secondary | ICD-10-CM

## 2022-05-21 ENCOUNTER — Ambulatory Visit (INDEPENDENT_AMBULATORY_CARE_PROVIDER_SITE_OTHER): Payer: Medicaid Other | Admitting: Licensed Clinical Social Worker

## 2022-05-21 DIAGNOSIS — F331 Major depressive disorder, recurrent, moderate: Secondary | ICD-10-CM

## 2022-05-25 NOTE — Progress Notes (Signed)
Virtual Visit via Video Note  I connected with [name] on 05/25/22 at 10:30 AM EST by a video enabled telemedicine application and verified that I am speaking with the correct person using two identifiers.  Location: Patient: Home  Provider: Virtual office   I discussed the limitations of evaluation and management by telemedicine and the availability of in person appointments. The patient expressed understanding and agreed to proceed.  History of Present Illness: Jeanette Hall has past history of depression symptoms   Observations/Objective: [name] is on time and oriented during session. She reports that since last session she has been stable and she has started working a part time job. She reports that she has been enjoying this and being able to get out the house. She reports that she is doing fine in her pregnancy and reports she has a doctors appointment coming up. She reports that she has been using coping skills. She reports that she spoke to the doctor and decided that she did not want to increase her medication dose since she has been doing better.  Assessment and Plan: Mental status evaluated. Assessed for issues of dangerousness. Evaluated current symptoms and discussed management.  Follow Up Instructions: Follow up on 06/11/2022 at 10:30a   I discussed the assessment and treatment plan with the patient. The patient was provided an opportunity to ask questions and all were answered. The patient agreed with the plan and demonstrated an understanding of the instructions.   The patient was advised to call back or seek an in-person evaluation if the symptoms worsen or if the condition fails to improve as anticipated.  I provided 20 minutes of non-face-to-face time during this encounter.   Allyson Sabal, [facility]

## 2022-06-03 ENCOUNTER — Ambulatory Visit (INDEPENDENT_AMBULATORY_CARE_PROVIDER_SITE_OTHER): Payer: Medicaid Other | Admitting: Obstetrics

## 2022-06-03 ENCOUNTER — Other Ambulatory Visit (HOSPITAL_COMMUNITY)
Admission: RE | Admit: 2022-06-03 | Discharge: 2022-06-03 | Disposition: A | Discharge status: Home / Self Care | Payer: Medicaid Other | Source: Ambulatory Visit | Attending: Obstetrics | Admitting: Obstetrics

## 2022-06-03 ENCOUNTER — Encounter: Payer: Self-pay | Admitting: Obstetrics

## 2022-06-03 VITALS — BP 143/65 | HR 93 | Wt 145.3 lb

## 2022-06-03 DIAGNOSIS — Z3481 Encounter for supervision of other normal pregnancy, first trimester: Secondary | ICD-10-CM

## 2022-06-03 DIAGNOSIS — Z3A12 12 weeks gestation of pregnancy: Secondary | ICD-10-CM | POA: Diagnosis not present

## 2022-06-03 DIAGNOSIS — Z3480 Encounter for supervision of other normal pregnancy, unspecified trimester: Secondary | ICD-10-CM | POA: Insufficient documentation

## 2022-06-03 NOTE — Addendum Note (Signed)
Addended by: Flonnie Hailstone on: 06/03/2022 10:45 AM   Modules accepted: Orders

## 2022-06-03 NOTE — Progress Notes (Signed)
Pt presents for NOB visit. No concerns at this time.

## 2022-06-03 NOTE — Progress Notes (Signed)
Subjective:    Jeanette Hall is being seen today for her first obstetrical visit.  This is not a planned pregnancy. She is at 38w3dgestation. Her obstetrical history is significant for  postpartum depression . Relationship with FOB: significant other, not living together. Patient does intend to breast feed. Pregnancy history fully reviewed.  The information documented in the HPI was reviewed and verified.  Menstrual History: OB History     Gravida  2   Para  1   Term  1   Preterm  0   AB  0   Living  1      SAB  0   IAB  0   Ectopic  0   Multiple  0   Live Births  1            Patient's last menstrual period was 02/08/2022.    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  2130 on 01/26/18   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    Past Surgical History:  Procedure Laterality Date   MASS EXCISION N/A 05/05/2016   Procedure: EXCISION  OF TONGUE MASS;  Surgeon: SLeta Baptist MD;  Location: [facility]  Service: ENT;  Laterality: N/A;  EXCISION  OF TONGUE MASS    (Not in a hospital admission)  Allergies  Allergen Reactions   Fish Allergy Anaphylaxis   Shellfish Allergy Anaphylaxis    Social History   Tobacco Use   Smoking status: Never   Smokeless tobacco: Never   Tobacco comments:    stepfather smokes outside  Substance Use Topics   Alcohol use: No    Family History  Problem Relation Age of Onset   Hypertension Mother    Schizophrenia Brother        schizophrenia spectrum per report   Asthma Maternal Uncle    Hypertension Maternal Grandmother    Asthma Maternal Grandmother    Cancer Maternal Grandmother    Diabetes Maternal Grandmother    Cancer Paternal Grandmother      Review of  Systems Constitutional: negative for weight loss Gastrointestinal: negative for vomiting Genitourinary:negative for genital lesions and vaginal discharge and dysuria Musculoskeletal:negative for back pain Behavioral/Psych: negative for abusive relationship, depression, illegal drug usage and tobacco use    Objective:    BP (!) 143/65   Pulse 93   Wt 145 lb 4.8 oz (65.9 kg)   LMP 02/08/2022   BMI 24.56 kg/m  General Appearance:    Alert, cooperative, no distress, appears stated age  Head:    Normocephalic, without obvious abnormality, atraumatic  Eyes:    PERRL, conjunctiva/corneas clear, EOM's intact, fundi    benign, both eyes  Ears:    Normal TM's and external ear canals, both ears  Nose:   Nares normal, septum midline, mucosa normal, no drainage    or sinus tenderness  Throat:   Lips, mucosa, and tongue normal; teeth and gums normal  Neck:   Supple, symmetrical, trachea midline, no adenopathy;    thyroid:  no enlargement/tenderness/nodules; no carotid   bruit or JVD  Back:     Symmetric, no curvature, ROM normal, no CVA tenderness  Lungs:     Clear to auscultation bilaterally, respirations unlabored  Chest Wall:    No tenderness or deformity   Heart:    Regular rate and rhythm, S1 and S2 normal, no murmur, rub   or gallop  Breast Exam:    No tenderness, masses, or nipple abnormality  Abdomen:     Soft, non-tender, bowel sounds active all four quadrants,    no masses, no organomegaly  Genitalia:    Normal female without lesion, discharge or tenderness  Extremities:   Extremities normal, atraumatic, no cyanosis or edema  Pulses:   2+ and symmetric all extremities  Skin:   Skin color, texture, turgor normal, no rashes or lesions  Lymph nodes:   Cervical, supraclavicular, and axillary nodes normal  Neurologic:   CNII-XII intact, normal strength, sensation and reflexes    throughout      Lab Review Urine pregnancy test Labs reviewed yes Radiologic studies reviewed  no  Assessment:    Pregnancy at 36w3dweeks    Plan:    1. Supervision of other normal pregnancy, antepartum Rx: - Cytology - PAP( [place]) - Panorama Prenatal Test Full Panel - [facility] Custom - Cervicovaginal ancillary only( [place]) - Culture, OB Urine   Prenatal vitamins.  Counseling provided regarding continued use of seat belts, cessation of alcohol consumption, smoking or use of illicit drugs; infection precautions i.e., influenza/TDAP immunizations, toxoplasmosis,CMV, parvovirus, listeria and varicella; workplace safety, exercise during pregnancy; routine dental care, safe medications, sexual activity, hot tubs, saunas, pools, travel, caffeine use, fish and methlymercury, potential toxins, hair treatments, varicose veins Weight gain recommendations per IOM guidelines reviewed: underweight/BMI< 18.5--> gain 28 - 40 lbs; normal weight/BMI 18.5 - 24.9--> gain 25 - 35 lbs; overweight/BMI 25 - 29.9--> gain 15 - 25 lbs; obese/BMI >30->gain  11 - 20 lbs Problem list reviewed and updated. FIRST/CF mutation testing/NIPT/QUAD SCREEN/fragile X/Ashkenazi Jewish population testing/Spinal muscular atrophy discussed: requested. Role of ultrasound in pregnancy discussed; fetal survey: requested. Amniocentesis discussed: not indicated. VBAC calculator score: VBAC consent form provided  Orders Placed This Encounter  Procedures   Culture, OB Urine   Panorama Prenatal Test Full Panel    ==========Department Information========== ID: [id number]Dept: [phone] Fax: [phone]    Order Specific Question:   Expected due date (MM/DD/YYYY):    Answer:   12/13/2022    Order Specific Question:   Is this a twin pregnancy? (viable, no vanished twin)    Answer:   No    Order Specific Question:   Is this a surrogate or egg donor pregnancy?     Answer:   No    Order Specific Question:   I want fetal sex included in the report:    Answer:   Yes    Order Specific Question:   Maternal Weight (lbs):    Answer:   138   Order Specific Question:   Which Microdeletion Panel should be ordered?    Answer:   22q11.2 Deletion    Order Specific Question:   What type of billing?    Answer:   BProgramme researcher, broadcasting/film/videoQuestion:   By placing this electronic order I confirm the testing ordered herein is medically necessary and this patient has been informed of the details of the genetic test(s) ordered, including the risks, benefits, and alternatives, and has consented to testing.  Answer:   Yes    Order Specific Question:   Select an order diagnosis: For additional options refer to [email or web address]    Answer:   Encounter for supervision of other normal pregnancy in first trimester [[id number]]   [facility] Custom    ==========Department Information========== ID: [id number] [address] [place], [place] [address] Dept: [phone] Dept Fax: [phone]     Order Specific Question:   Specify the name or ID of a valid [facility] Custom Panel:    Answer:   HBasic    Order Specific Question:   Is patient pregnant?    Answer:   Yes    Order Specific Question:   Practice ensures that HIPAA consent is obtained and will make available to [facility] upon request?    Answer:   Yes    Order Specific Question:   By placing this electronic order I confirm the testing ordered herein is medically necessary and this patient has been informed of the details of the genetic test(s) ordered, including the risks, benefits, and alternatives, and has consented to testing.    Answer:   Yes    Order Specific Question:   What type of billing?    Answer:   Programme researcher, broadcasting/film/video Question:   Select an order diagnosis:  For additional options refer to [email or web address]    Answer:   Encounter of female for testing for genetic disease carrier status for procreative management [phone]    Order Specific Question:   Tay-Sachs add-on test?    Answer:   No    Follow up in 4 weeks.    Shelly Bombard, MD 06/03/2022 10:43 AM

## 2022-06-04 LAB — HCV INTERPRETATION

## 2022-06-04 LAB — CBC/D/PLT+RPR+RH+ABO+RUBIGG...
Antibody Screen: NEGATIVE
Basophils Absolute: 0 10*3/uL (ref 0.0–0.2)
Basos: 0 %
EOS (ABSOLUTE): 1 10*3/uL — ABNORMAL HIGH (ref 0.0–0.4)
Eos: 17 %
HCV Ab: NONREACTIVE
HIV Screen 4th Generation wRfx: NONREACTIVE
Hematocrit: 35.9 % (ref 34.0–46.6)
Hemoglobin: 11.5 g/dL (ref 11.1–15.9)
Hepatitis B Surface Ag: NEGATIVE
Immature Grans (Abs): 0 10*3/uL (ref 0.0–0.1)
Immature Granulocytes: 0 %
Lymphocytes Absolute: 1.6 10*3/uL (ref 0.7–3.1)
Lymphs: 26 %
MCH: 26.4 pg — ABNORMAL LOW (ref 26.6–33.0)
MCHC: 32 g/dL (ref 31.5–35.7)
MCV: 83 fL (ref 79–97)
Monocytes Absolute: 0.2 10*3/uL (ref 0.1–0.9)
Monocytes: 4 %
Neutrophils Absolute: 3.3 10*3/uL (ref 1.4–7.0)
Neutrophils: 53 %
Platelets: 384 10*3/uL (ref 150–450)
RBC: 4.35 x10E6/uL (ref 3.77–5.28)
RDW: 13.1 % (ref 11.7–15.4)
RPR Ser Ql: NONREACTIVE
Rh Factor: POSITIVE
Rubella Antibodies, IGG: 1.76 index (ref >0.99)
WBC: 6.2 10*3/uL (ref 3.4–10.8)

## 2022-06-04 LAB — CERVICOVAGINAL ANCILLARY ONLY
Chlamydia: NEGATIVE
Comment: NEGATIVE
Comment: NEGATIVE
Comment: NORMAL
Neisseria Gonorrhea: NEGATIVE
Trichomonas: NEGATIVE

## 2022-06-05 LAB — CYTOLOGY - PAP: Diagnosis: NEGATIVE

## 2022-06-05 LAB — URINE CULTURE, OB REFLEX

## 2022-06-05 LAB — CULTURE, OB URINE

## 2022-06-10 LAB — PANORAMA PRENATAL TEST FULL PANEL:PANORAMA TEST PLUS 5 ADDITIONAL MICRODELETIONS: FETAL FRACTION: 4.8

## 2022-06-11 ENCOUNTER — Ambulatory Visit (INDEPENDENT_AMBULATORY_CARE_PROVIDER_SITE_OTHER): Payer: Medicaid Other | Admitting: Licensed Clinical Social Worker

## 2022-06-11 DIAGNOSIS — F331 Major depressive disorder, recurrent, moderate: Secondary | ICD-10-CM | POA: Diagnosis not present

## 2022-06-13 NOTE — Progress Notes (Signed)
   THERAPIST PROGRESS NOTE  Session Time: 30 minutes  Participation Level: Active  Behavioral Response: CasualAlertEuthymic  Type of Therapy: Individual Therapy  Treatment Goals addressed: Depression management  ProgressTowards Goals: Progressing  Interventions: CBT and Strength-based  Summary: Jeanette Hall is a 21 y.o. female who presents with symptoms of depression. [name] reports that her symptoms have continued to improve but she has been feeling dizzy and faint lately. She reports that she does not know if this is due to her pregnancy or if it is a medication side effect. She reports that she will be contacting her OB office to make a sooner appt.   Suicidal/Homicidal: Nowithout intent/plan  Therapist Response: Mental status assessed. Assessed for issues of dangerousness.  Encouraged client to practice coping skills and continued medication compliance. Informed CT that this Clinician would be leaving the office and informed she can be scheduled for follow up with another Clinician in the office, clinician will meet with this client for the final time on 06/25/22. She is agreeable to this.  Plan: Return again in 2 weeks.  Diagnosis: Major Depressive Disorder  Collaboration of Care: Medication Management AEB 07/09/2022  Patient/Guardian was advised Release of Information must be obtained prior to any record release in order to collaborate their care with an outside provider. Patient/Guardian was advised if they have not already done so to contact the registration department to sign all necessary forms in order for [place] to release information regarding their care.   Consent: Patient/Guardian gives verbal consent for treatment and assignment of benefits for services provided during this visit. Patient/Guardian expressed understanding and agreed to proceed.   Allyson Sabal, [facility] 06/13/2022

## 2022-06-15 LAB — HORIZON CUSTOM: REPORT SUMMARY: POSITIVE — AB

## 2022-06-16 NOTE — Progress Notes (Signed)
TC. 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 Reassurance given. Plan for collection of carrier testing on FOB at office visit on 07/01/22. Pt advised that we can make a referral to MFM genetic counselor now or in the future if desired. Pt will wait for FOB results. Visit note for 12/20 updated. Pt also reporting frequent dizziness and near syncopal episode X 1. Advised dizziness is common in early pregnancy due to CV changes in pregnancy. Advised to seek care for acute dizziness or syncope. Pt verbalized understanding.

## 2022-06-25 ENCOUNTER — Ambulatory Visit (HOSPITAL_COMMUNITY): Payer: Medicaid Other | Admitting: Licensed Clinical Social Worker

## 2022-07-01 ENCOUNTER — Encounter: Payer: Self-pay | Admitting: Obstetrics

## 2022-07-01 ENCOUNTER — Ambulatory Visit (INDEPENDENT_AMBULATORY_CARE_PROVIDER_SITE_OTHER): Payer: Medicaid Other | Admitting: Obstetrics

## 2022-07-01 VITALS — BP 119/71 | HR 90 | Wt 150.9 lb

## 2022-07-01 DIAGNOSIS — Z3480 Encounter for supervision of other normal pregnancy, unspecified trimester: Secondary | ICD-10-CM | POA: Diagnosis not present

## 2022-07-01 DIAGNOSIS — O99712 Diseases of the skin and subcutaneous tissue complicating pregnancy, second trimester: Secondary | ICD-10-CM

## 2022-07-01 DIAGNOSIS — Z3482 Encounter for supervision of other normal pregnancy, second trimester: Secondary | ICD-10-CM

## 2022-07-01 DIAGNOSIS — L299 Pruritus, unspecified: Secondary | ICD-10-CM | POA: Diagnosis not present

## 2022-07-01 DIAGNOSIS — D508 Other iron deficiency anemias: Secondary | ICD-10-CM | POA: Diagnosis not present

## 2022-07-01 DIAGNOSIS — Z3A16 16 weeks gestation of pregnancy: Secondary | ICD-10-CM

## 2022-07-01 DIAGNOSIS — J301 Allergic rhinitis due to pollen: Secondary | ICD-10-CM

## 2022-07-01 MED ORDER — LORATADINE 10 MG PO TABS: 10.0000 mg | ORAL_TABLET | Freq: Every day | ORAL | 11 refills | Status: DC

## 2022-07-01 MED ORDER — PRENATE MINI 18-0.6-0.4-350 MG PO CAPS: 1.0000 | ORAL_CAPSULE | Freq: Every day | ORAL | 4 refills | Status: DC

## 2022-07-01 MED ORDER — FERROUS SULFATE 325 (65 FE) MG PO TABS: 325.0000 mg | ORAL_TABLET | ORAL | 5 refills | Status: AC

## 2022-07-01 NOTE — Progress Notes (Signed)
Subjective:  Jeanette Hall is a 21 y.o. G2P1001 at 54w3dbeing seen today for ongoing prenatal care.  She is currently monitored for the following issues for this low-risk pregnancy and has Hx of tonsillectomy; Vitamin D deficiency; History of suicide attempt; Eczema; Asthma; Anxiety disorder, unspecified; Constipation; Insomnia; Major depressive disorder, recurrent episode, moderate (H[place]; Currently pregnant; History of postpartum depression, currently pregnant; and Supervision of other normal pregnancy, antepartum on their problem list.  Patient reports  severe itching all over .  Contractions: Not present. Vag. Bleeding: None.  Movement: Present. Denies leaking of fluid.   The following portions of the patient's history were reviewed and updated as appropriate: allergies, current medications, past family history, past medical history, past social history, past surgical history and problem list. Problem list updated.  Objective:   Vitals:   07/01/22 1049 07/01/22 1058  BP: (!) 142/73 119/71  Pulse: (!) 101 90  Weight: 150 lb 14.4 oz (68.4 kg)     Fetal Status: Fetal Heart Rate (bpm): 152   Movement: Present     General:  Alert, oriented and cooperative. Patient is in no acute distress.  Skin: Skin is warm and dry. No rash noted.   Cardiovascular: Normal heart rate noted  Respiratory: Normal respiratory effort, no problems with respiration noted  Abdomen: Soft, gravid, appropriate for gestational age. Pain/Pressure: Absent     Pelvic:  Cervical exam deferred        Extremities: Normal range of motion.  Edema: None  Mental Status: Normal mood and affect. Normal behavior. Normal judgment and thought content.   Urinalysis:      Assessment and Plan:  Pregnancy: G2P1001 at 145w3d1. Supervision of other normal pregnancy, antepartum Rx: - AFP, Serum, Open Spina Bifida - Prenat-FeCbn-FeAsp-Meth-FA-DHA (PRENATE MINI) 18-0.6-0.4-350 MG CAPS; Take 1 capsule by mouth daily before breakfast.   Dispense: 90 capsule; Refill: 4  2. Iron deficiency anemia secondary to inadequate dietary iron intake Rx: - ferrous sulfate 325 (65 FE) MG tablet; Take 1 tablet (325 mg total) by mouth every other day.  Dispense: 60 tablet; Refill: 5 - Ferritin  3. Pruritus gravidarum, second trimester - R/O cholestasis Rx: - Comprehensive metabolic panel - Bile acids, total  4. Seasonal allergic rhinitis due to pollen Rx: - loratadine (CLARITIN) 10 MG tablet; Take 1 tablet (10 mg total) by mouth daily.  Dispense: 30 tablet; Refill: 11  Preterm labor symptoms and general obstetric precautions including but not limited to vaginal bleeding, contractions, leaking of fluid and fetal movement were reviewed in detail with the patient. Please refer to After Visit Summary for other counseling recommendations.   Return in about 4 weeks (around 07/29/2022) for ROB.   HaShelly BombardMD 07/01/2022

## 2022-07-01 NOTE — Progress Notes (Signed)
Pt reports fetal movement with occasional headaches that have been relieved by Tylenol.

## 2022-07-03 LAB — COMPREHENSIVE METABOLIC PANEL
ALT: 12 IU/L (ref 0–32)
AST: 20 IU/L (ref 0–40)
Albumin/Globulin Ratio: 1.4 (ref 1.2–2.2)
Albumin: 3.8 g/dL — ABNORMAL LOW (ref 4.0–5.0)
Alkaline Phosphatase: 71 IU/L (ref 44–121)
BUN/Creatinine Ratio: 14 (ref 9–23)
BUN: 9 mg/dL (ref 6–20)
Bilirubin Total: <0.2 mg/dL (ref 0.0–1.2)
CO2: 21 mmol/L (ref 20–29)
Calcium: 9.6 mg/dL (ref 8.7–10.2)
Chloride: 103 mmol/L (ref 96–106)
Creatinine, Ser: 0.65 mg/dL (ref 0.57–1.00)
Globulin, Total: 2.7 g/dL (ref 1.5–4.5)
Glucose: 63 mg/dL — ABNORMAL LOW (ref 70–99)
Potassium: 4.1 mmol/L (ref 3.5–5.2)
Sodium: 137 mmol/L (ref 134–144)
Total Protein: 6.5 g/dL (ref 6.0–8.5)
eGFR: 128 mL/min/{1.73_m2} (ref >59)

## 2022-07-03 LAB — AFP, SERUM, OPEN SPINA BIFIDA
AFP MoM: 0.69
AFP Value: 25.8 ng/mL
Gest. Age on Collection Date: 16 weeks
Maternal Age At EDD: 21.5 yr
OSBR Risk 1 IN: 10000
Test Results:: NEGATIVE
Weight: 150 [lb_av]

## 2022-07-03 LAB — BILE ACIDS, TOTAL: Bile Acids Total: 9.5 umol/L (ref 0.0–10.0)

## 2022-07-03 LAB — FERRITIN: Ferritin: 24 ng/mL (ref 15–150)

## 2022-07-08 ENCOUNTER — Encounter (HOSPITAL_COMMUNITY): Payer: Self-pay | Admitting: Obstetrics & Gynecology

## 2022-07-08 ENCOUNTER — Inpatient Hospital Stay (HOSPITAL_COMMUNITY)
Admission: AD | Admit: 2022-07-08 | Discharge: 2022-07-08 | Disposition: A | Discharge status: Home / Self Care | Payer: Medicaid Other | Attending: Obstetrics & Gynecology | Admitting: Obstetrics & Gynecology

## 2022-07-08 DIAGNOSIS — O99612 Diseases of the digestive system complicating pregnancy, second trimester: Secondary | ICD-10-CM | POA: Diagnosis present

## 2022-07-08 DIAGNOSIS — Z3A17 17 weeks gestation of pregnancy: Secondary | ICD-10-CM | POA: Diagnosis not present

## 2022-07-08 DIAGNOSIS — K5900 Constipation, unspecified: Secondary | ICD-10-CM | POA: Insufficient documentation

## 2022-07-08 DIAGNOSIS — Z3480 Encounter for supervision of other normal pregnancy, unspecified trimester: Secondary | ICD-10-CM

## 2022-07-08 LAB — URINALYSIS, ROUTINE W REFLEX MICROSCOPIC
Bilirubin Urine: NEGATIVE
Glucose, UA: NEGATIVE mg/dL
Hgb urine dipstick: NEGATIVE
Ketones, ur: NEGATIVE mg/dL
Nitrite: NEGATIVE
Protein, ur: 30 mg/dL — AB
Specific Gravity, Urine: 1.03 (ref 1.005–1.030)
pH: 6 (ref 5.0–8.0)

## 2022-07-08 MED ORDER — DOCUSATE SODIUM 100 MG PO CAPS: 100.0000 mg | ORAL_CAPSULE | Freq: Two times a day (BID) | ORAL | 2 refills | Status: DC | PRN

## 2022-07-08 MED ORDER — POLYETHYLENE GLYCOL 3350 17 G PO PACK: 17.0000 g | PACK | Freq: Every day | ORAL | 0 refills | Status: DC

## 2022-07-08 NOTE — MAU Note (Signed)
Soaps suds enema administered. Patient tolerated well.

## 2022-07-08 NOTE — Discharge Instructions (Signed)

## 2022-07-08 NOTE — MAU Note (Signed)
...  Jeanette Hall is a 21 y.o. at 37w3dhere in MAU reporting: Upon waking up around 1400 today she noticed she was having a constant pain across her whole lower abdomen that at times is sharp and other times feels like pressure. She reports she felt this pain one week and a half ago but the pain went away. She reports she has not had a normal bowel movement for more than one week now. She reports she had a small bowel movement earlier but it was very hard and small. Denies VB or LOF. Denies vaginal itching, and vaginal odors. Last IC 12/25.  Onset of complaint:  1430 Pain score:  4/10 lower abdomen  Vitals:   07/08/22 1649  BP: 116/66  Pulse: 75  Resp: 15  Temp: 98.3 F (36.8 C)  SpO2: 99%     FHT: 150's-160-'s doppler Lab orders placed from triage:  UA

## 2022-07-08 NOTE — MAU Provider Note (Signed)
History     CSN: [id number]  Arrival date and time: 07/08/22 1622   Event Date/Time   First Provider Initiated Contact with Patient 07/08/22 1705      Chief Complaint  Patient presents with   Abdominal Pain   HPI Jeanette Hall is a 21 y.o. G2P1001 at [place] who presents to MAU with chief complaint of pain across her entire lower abdomen. This is a new problem, onset today around 1400 hours. Patient also states she has not had a normal bowel movement in about ten days. She was able to pass a very small amount of tiny hard stool earlier today. She denies concern for contractions. She is not experiencing vaginal bleeding, abnormal discharge, fever, or illness.  Patient receives care with [facility].  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  2130 on 01/26/18   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    Past Surgical History:  Procedure Laterality Date   MASS EXCISION N/A 05/05/2016   Procedure: EXCISION  OF TONGUE MASS;  Surgeon: Leta Baptist, MD;  Location: [place];  Service: ENT;  Laterality: N/A;  EXCISION  OF TONGUE MASS   WISDOM TOOTH EXTRACTION      Family History  Problem Relation Age of Onset   Hypertension Mother    Schizophrenia Brother        schizophrenia spectrum per report   Asthma Maternal Uncle    Hypertension Maternal Grandmother    Asthma Maternal Grandmother    Cancer Maternal Grandmother    Diabetes Maternal Grandmother    Cancer Paternal Grandmother     Social History   Tobacco Use   Smoking status: Never   Smokeless  tobacco: Never   Tobacco comments:    stepfather smokes outside  Vaping Use   Vaping Use: Never used  Substance Use Topics   Alcohol use: No   Drug use: Yes    Types: Marijuana    Comment: last used 07/01/2022    Allergies:  Allergies  Allergen Reactions   Fish Allergy Anaphylaxis   Shellfish Allergy Anaphylaxis    Medications Prior to Admission  Medication Sig Dispense Refill Last Dose   sertraline (ZOLOFT) 25 MG tablet Take 1 tablet (25 mg total) by mouth daily. 30 tablet 2 Past Month   albuterol (VENTOLIN HFA) 108 (90 Base) MCG/ACT inhaler Inhale 2 puffs into the lungs every 4 (four) hours as needed for wheezing or shortness of breath. Wheezing or shortness of breath 6.7 g 0    Blood Pressure Monitoring (BLOOD PRESSURE KIT) DEVI 1 Device by Does not apply route once a week. 1 each 0    ferrous sulfate 325 (65 FE) MG tablet Take 1 tablet (325 mg total) by mouth every other day. 60 tablet 5    folic acid (  FOLVITE) 1 MG tablet Take 1 mg by mouth daily. (Patient not taking: Reported on 07/01/2022)      hydrocerin (EUCERIN) CREA Apply 1 Application topically 3 (three) times daily. (Patient not taking: Reported on 07/01/2022) 454 g 0    loratadine (CLARITIN) 10 MG tablet Take 1 tablet (10 mg total) by mouth daily. 30 tablet 11    mineral oil-hydrophilic petrolatum (AQUAPHOR) ointment Apply topically 2 (two) times daily. (Patient not taking: Reported on 07/01/2022) 420 g 0    Prenat-FeCbn-FeAsp-Meth-FA-DHA (PRENATE MINI) 18-0.6-0.4-350 MG CAPS Take 1 capsule by mouth daily before breakfast. 90 capsule 4    Prenatal 28-0.8 MG TABS Take 1 tablet by mouth daily. 30 tablet 12    Prenatal Vit-Fe Fumarate-FA (PRENATAL PLUS VITAMIN/MINERAL) 27-1 MG TABS Take 1 tablet by mouth daily at 6 (six) AM. (Patient not taking: Reported on 07/01/2022)       Review of Systems  Gastrointestinal:  Positive for abdominal pain and constipation.  All other systems reviewed and are negative.  Physical Exam    Blood pressure (!) 128/59, pulse 66, temperature 98.3 F (36.8 C), temperature source Oral, resp. rate 15, height 5' 3.5" (1.613 m), weight 68 kg, last menstrual period 02/08/2022, SpO2 100 %.  Physical Exam Vitals and nursing note reviewed. Exam conducted with a chaperone present.  Constitutional:      Appearance: She is well-developed. She is not ill-appearing.  Cardiovascular:     Rate and Rhythm: Normal rate and regular rhythm.     Heart sounds: Normal heart sounds.  Pulmonary:     Effort: Pulmonary effort is normal.     Breath sounds: Normal breath sounds.  Abdominal:     General: Bowel sounds are normal.     Tenderness: There is generalized abdominal tenderness.     Comments: Gravid  Skin:    Capillary Refill: Capillary refill takes less than 2 seconds.  Neurological:     Mental Status: She is alert and oriented to person, place, and time.  Psychiatric:        Mood and Affect: Mood normal.        Behavior: Behavior normal.     MAU Course  Procedures  MDM Orders Placed This Encounter  Procedures   Urinalysis, Routine w reflex microscopic Urine, Clean Catch   Soap suds enema   Discharge patient   Patient Vitals for the past 24 hrs:  BP Temp Temp src Pulse Resp SpO2 Height Weight  07/08/22 1812 (!) 128/59 -- -- 66 -- -- -- --  07/08/22 1700 (!) 113/56 -- -- 80 -- 100 % -- --  07/08/22 1649 116/66 98.3 F (36.8 C) Oral 75 15 99 % 5' 3.5" (1.613 m) 68 kg   Results for orders placed or performed during the hospital encounter of 07/08/22 (from the past 24 hour(s))  Urinalysis, Routine w reflex microscopic Urine, Clean Catch     Status: Abnormal   Collection Time: 07/08/22  4:22 PM  Result Value Ref Range   Color, Urine YELLOW YELLOW   APPearance CLOUDY (A) CLEAR   Specific Gravity, Urine 1.030 1.005 - 1.030   pH 6.0 5.0 - 8.0   Glucose, UA NEGATIVE NEGATIVE mg/dL   Hgb urine dipstick NEGATIVE NEGATIVE   Bilirubin Urine NEGATIVE NEGATIVE   Ketones, ur NEGATIVE  NEGATIVE mg/dL   Protein, ur 30 (A) NEGATIVE mg/dL   Nitrite NEGATIVE NEGATIVE   Leukocytes,Ua TRACE (A) NEGATIVE   RBC / HPF 0-5 0 - 5 RBC/hpf   WBC,  UA 0-5 0 - 5 WBC/hpf   Bacteria, UA RARE (A) NONE SEEN   Squamous Epithelial / LPF 21-50 0 - 5 /HPF   Mucus PRESENT    Meds ordered this encounter  Medications   polyethylene glycol (MIRALAX) 17 g packet    Sig: Take 17 g by mouth daily.    Dispense:  14 each    Refill:  0   docusate sodium (COLACE) 100 MG capsule    Sig: Take 1 capsule (100 mg total) by mouth 2 (two) times daily as needed.    Dispense:  30 capsule    Refill:  2   Assessment and Plan  --21 y.o. G2P1001 at [redacted]w[redacted]d --[place] by [place] --Constipation --Patient with multiple bowel movements s/p soap suds enema --New nutrition, movement and medication regimen to prevent recurrence --Pain resolved prior to discharge --Discharge home in stable condition  SDarlina Rumpf MSA, MSN, CNM

## 2022-07-09 ENCOUNTER — Telehealth (HOSPITAL_COMMUNITY): Payer: Medicaid Other | Admitting: Psychiatry

## 2022-07-09 ENCOUNTER — Telehealth (INDEPENDENT_AMBULATORY_CARE_PROVIDER_SITE_OTHER): Payer: Medicaid Other | Admitting: Student in an Organized Health Care Education/Training Program

## 2022-07-09 ENCOUNTER — Encounter (HOSPITAL_COMMUNITY): Payer: Self-pay | Admitting: Student in an Organized Health Care Education/Training Program

## 2022-07-09 DIAGNOSIS — Z8659 Personal history of other mental and behavioral disorders: Secondary | ICD-10-CM | POA: Diagnosis not present

## 2022-07-09 DIAGNOSIS — O99891 Other specified diseases and conditions complicating pregnancy: Secondary | ICD-10-CM

## 2022-07-09 DIAGNOSIS — Z3A16 16 weeks gestation of pregnancy: Secondary | ICD-10-CM | POA: Diagnosis not present

## 2022-07-09 DIAGNOSIS — F331 Major depressive disorder, recurrent, moderate: Secondary | ICD-10-CM | POA: Diagnosis not present

## 2022-07-09 MED ORDER — SERTRALINE HCL 25 MG PO TABS: 25.0000 mg | ORAL_TABLET | Freq: Every day | ORAL | 2 refills | Status: DC
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
## 2022-07-13 NOTE — L&D Delivery Note (Addendum)
OB/GYN Faculty Practice Delivery Note  Jeanette Hall is a 22 y.o. G2P1001 s/p VD at [redacted]w[redacted]d. She was admitted for IOL A1GDM.   ROM: 3h 53m with clear fluid (AROM) GBS Status:  Negative/-- (05/06 1050) Maximum Maternal Temperature: 98.4  Labor Progress: Initial SVE: 4/60/-3. Labor augmented with PO cytotec, FB, and AROM, pitocin. She then progressed to complete.   Delivery Date/Time: 11/24/22 1653 Delivery: Called to room and patient was complete and pushing. Head delivered DOA. Nuchal present x1, delivered through without difficulty. Shoulder and body delivered in usual fashion. Infant with spontaneous cry, placed on mother's abdomen, dried and stimulated. Cord clamped x 2 after 1-minute delay, and cut by FOB. Cord blood drawn. Placenta delivered spontaneously with gentle cord traction. Fundus firm with massage and Pitocin. Labia, perineum, vagina, and cervix inspected with L periurethral abrasion, hemostatic, no repair.  Baby Weight: pending  Placenta: 3 vessel, intact. Sent to L&D Complications: None Lacerations: L periurethral, hemostatic, no repair needed EBL: 75 mL Analgesia: Epidural   Infant:  APGAR (1 MIN):  8 APGAR (5 MINS):  9  Vonna Drafts, MD FM PGY1, Faculty Practice [facility] [employer], [facility] 11/24/2022, 5:09 PM   Fellow ATTESTATION  I was present and gloved for this delivery and agree with the above documentation in the resident's note except as below.  Celedonio Savage, MD [facility] [employer] (Faculty Practice) 11/24/2022, 6:51 PM

## 2022-07-20 ENCOUNTER — Other Ambulatory Visit: Payer: Self-pay | Admitting: Obstetrics and Gynecology

## 2022-07-20 ENCOUNTER — Ambulatory Visit: Payer: Medicaid Other | Attending: Obstetrics

## 2022-07-20 DIAGNOSIS — O99012 Anemia complicating pregnancy, second trimester: Secondary | ICD-10-CM | POA: Diagnosis not present

## 2022-07-20 DIAGNOSIS — L309 Dermatitis, unspecified: Secondary | ICD-10-CM

## 2022-07-20 DIAGNOSIS — Z363 Encounter for antenatal screening for malformations: Secondary | ICD-10-CM | POA: Insufficient documentation

## 2022-07-20 DIAGNOSIS — Z3480 Encounter for supervision of other normal pregnancy, unspecified trimester: Secondary | ICD-10-CM

## 2022-07-20 DIAGNOSIS — O3680X0 Pregnancy with inconclusive fetal viability, not applicable or unspecified: Secondary | ICD-10-CM

## 2022-07-20 DIAGNOSIS — Z3A19 19 weeks gestation of pregnancy: Secondary | ICD-10-CM | POA: Insufficient documentation

## 2022-07-29 ENCOUNTER — Ambulatory Visit (INDEPENDENT_AMBULATORY_CARE_PROVIDER_SITE_OTHER): Payer: Medicaid Other

## 2022-07-29 VITALS — BP 122/74 | HR 94 | Wt 157.8 lb

## 2022-07-29 DIAGNOSIS — O99612 Diseases of the digestive system complicating pregnancy, second trimester: Secondary | ICD-10-CM

## 2022-07-29 DIAGNOSIS — J45909 Unspecified asthma, uncomplicated: Secondary | ICD-10-CM

## 2022-07-29 DIAGNOSIS — L309 Dermatitis, unspecified: Secondary | ICD-10-CM

## 2022-07-29 DIAGNOSIS — K5900 Constipation, unspecified: Secondary | ICD-10-CM

## 2022-07-29 DIAGNOSIS — Z3480 Encounter for supervision of other normal pregnancy, unspecified trimester: Secondary | ICD-10-CM

## 2022-07-29 DIAGNOSIS — Z3A20 20 weeks gestation of pregnancy: Secondary | ICD-10-CM

## 2022-07-29 DIAGNOSIS — Z3482 Encounter for supervision of other normal pregnancy, second trimester: Secondary | ICD-10-CM

## 2022-07-29 MED ORDER — HYDROCORTISONE 2.5 % EX OINT: TOPICAL_OINTMENT | Freq: Two times a day (BID) | CUTANEOUS | 0 refills | Status: DC | PRN

## 2022-07-29 MED ORDER — ALBUTEROL SULFATE HFA 108 (90 BASE) MCG/ACT IN AERS: 2.0000 | INHALATION_SPRAY | RESPIRATORY_TRACT | 1 refills | Status: DC | PRN

## 2022-07-29 NOTE — Progress Notes (Signed)
LOW-RISK PREGNANCY OFFICE VISIT  Patient name: Jeanette Hall MRN [id number]  Date of birth: 2000/12/30 Chief Complaint:   Routine Prenatal Visit  Subjective:   Jeanette Hall is a 22 y.o. G80P1001 female at 74w3dwith an Estimated Date of Delivery: 12/13/22 being seen today for ongoing management of a low-risk pregnancy aeb has Hx of tonsillectomy; Vitamin D deficiency; History of suicide attempt; Eczema; Asthma; Anxiety disorder, unspecified; Constipation; Insomnia; Major depressive disorder, recurrent episode, moderate ([place]; Currently pregnant; History of postpartum depression, currently pregnant; and Supervision of other normal pregnancy, antepartum on their problem list.  Patient presents today, with SO and son, with  complaint of abdominal itching .    Patient reports she has been experiencing increased itching in her abdomen despite Vaseline and Eucerin usage.  Patient endorses a history of eczema and notes that her symptoms are present everywhere, but it is worse on her abdomen. Patient endorses fetal movement. Patient reports occasional abdominal cramping, but denies contractions.  Patient denies vaginal concerns including abnormal discharge, leaking of fluid, and bleeding. No issues with urination or diarrhea. However, she reports constipation and that she has a bowel movement 1-2x/week that is hard to pass.  She took an enema and has been taking Miralax occasionally and Colace daily.  She endorses good water intake, but does not eat high fiber diet.    Contractions: Not present. Vag. Bleeding: None.  Movement: Present.  Reviewed past medical,surgical, social, obstetrical and family history as well as problem list, medications and allergies.  Objective   Vitals:   07/29/22 1342  BP: 122/74  Pulse: 94  Weight: 157 lb 12.8 oz (71.6 kg)  Body mass index is 27.51 kg/m.  Total Weight Gain:14 lb 12.8 oz (6.713 kg)         Physical Examination:   General appearance: Well appearing,  and in no distress  Mental status: Alert, oriented to person, place, and time  Skin: Warm & dry  Cardiovascular: Normal heart rate noted  Respiratory: Normal respiratory effort, no distress  Abdomen: Soft, gravid, nontender, AGA with fundus at umbilicus  Pelvic: Cervical exam deferred           Extremities: Edema: Trace  Fetal Status: Fetal Heart Rate (bpm): 152  Movement: Present   No results found for this or any previous visit (from the past 24 hour(s)).  Assessment & Plan:  Low-risk pregnancy of a 22y.o., G2P1001 at 255w3dith an Estimated Date of Delivery: 12/13/22   1. Supervision of other normal pregnancy, antepartum -Anticipatory guidance for upcoming appts. -Patient to schedule next appt in 4 weeks for an in-person visit.  2. [redacted] weeks gestation of pregnancy -Reviewed concerns and complaints.  3. Eczema, unspecified type -Discussed management of eczema. Plan to add hydrocortisone cream to regimen. Rx sent. -Reassured that abdominal itching is not associated with cholestasis. However, since will monitor and if no improvement of itching with hydrocortisone, will check bile acids at GTT visit to rule out. Patient agreeable.  -Dermatology consult placed.   4. Uncomplicated asthma, unspecified asthma severity, unspecified whether persistent -Patient requests and sent prescription for Albuterol inhaler.   5. Constipation in pregnancy in second trimester -Discussed management of constipation. -Instructed to increase Colace to BID and miralax to daily. Decrease with diarrhea or onset of increased frequency in bowel movements.  -Further encouraged to increase fiber in diet. Information provided in AVS.      Meds:  Meds ordered this encounter  Medications   albuterol (VENTOLIN  HFA) 108 (90 Base) MCG/ACT inhaler    Sig: Inhale 2 puffs into the lungs every 4 (four) hours as needed for wheezing or shortness of breath. Wheezing or shortness of breath    Dispense:  8 g    Refill:   1    Order Specific Question:   Supervising Provider    Answer:   Donnamae Jude [[id number]]   hydrocortisone 2.5 % ointment    Sig: Apply topically 2 (two) times daily as needed.    Dispense:  28.35 g    Refill:  0    Order Specific Question:   Supervising Provider    Answer:   Donnamae Jude [[id number]]   Labs/procedures today:  Lab Orders  No laboratory test(s) ordered today     Reviewed: Preterm labor symptoms and general obstetric precautions including but not limited to vaginal bleeding, contractions, leaking of fluid and fetal movement were reviewed in detail with the patient.  All questions were answered.  Follow-up: Return in about 4 weeks (around 08/26/2022).  Orders Placed This Encounter  Procedures   Ambulatory referral to Dermatology   Maryann Conners MSN, CNM 07/29/2022

## 2022-07-29 NOTE — Progress Notes (Signed)
Patient presents for [place] visit. Pt reports that she has been very itchy on her stomach. She had cholestasis with her previous pregnancy. Pt also reports having an increase in shortness of breath due to her asthma and would like a refill for her inhaler. No other concerns at this time.

## 2022-08-13 ENCOUNTER — Encounter (HOSPITAL_COMMUNITY): Payer: Medicaid Other | Admitting: Student

## 2022-08-20 ENCOUNTER — Encounter: Payer: Self-pay | Admitting: Obstetrics

## 2022-08-20 ENCOUNTER — Ambulatory Visit (INDEPENDENT_AMBULATORY_CARE_PROVIDER_SITE_OTHER): Payer: Self-pay | Admitting: Obstetrics

## 2022-08-20 VITALS — BP 130/78 | HR 97 | Wt 162.3 lb

## 2022-08-20 DIAGNOSIS — Z3A23 23 weeks gestation of pregnancy: Secondary | ICD-10-CM

## 2022-08-20 DIAGNOSIS — Z8659 Personal history of other mental and behavioral disorders: Secondary | ICD-10-CM

## 2022-08-20 DIAGNOSIS — Z3480 Encounter for supervision of other normal pregnancy, unspecified trimester: Secondary | ICD-10-CM

## 2022-08-20 DIAGNOSIS — L309 Dermatitis, unspecified: Secondary | ICD-10-CM

## 2022-08-20 DIAGNOSIS — O99891 Other specified diseases and conditions complicating pregnancy: Secondary | ICD-10-CM

## 2022-08-20 NOTE — Progress Notes (Signed)
69w4dpt presents for pain in the left breast for 2 days. Pt also reports feeling a knot in the breast. No pain or knots today, pt reports leaking from the breast a few days ago.

## 2022-08-20 NOTE — Progress Notes (Signed)
Subjective:  Jeanette Hall is a 22 y.o. G2P1001 at 73w4dbeing seen today for ongoing prenatal care.  She is currently monitored for the following issues for this low-risk pregnancy and has Hx of tonsillectomy; Vitamin D deficiency; History of suicide attempt; Eczema; Asthma; Anxiety disorder, unspecified; Constipation; Insomnia; Major depressive disorder, recurrent episode, moderate ([place]; Currently pregnant; History of postpartum depression, currently pregnant; and Supervision of other normal pregnancy, antepartum on their problem list.  Patient reports  breast tenderness .  Contractions: Irritability. Vag. Bleeding: None.  Movement: Present. Denies leaking of fluid.   The following portions of the patient's history were reviewed and updated as appropriate: allergies, current medications, past family history, past medical history, past social history, past surgical history and problem list. Problem list updated.  Objective:   Vitals:   08/20/22 1546  BP: 130/78  Pulse: 97  Weight: 162 lb 4.8 oz (73.6 kg)    Fetal Status: Fetal Heart Rate (bpm): 155   Movement: Present     General:  Alert, oriented and cooperative. Patient is in no acute distress.  Skin: Skin is warm and dry. No rash noted.   Cardiovascular: Normal heart rate noted  Respiratory: Normal respiratory effort, no problems with respiration noted  Abdomen: Soft, gravid, appropriate for gestational age. Pain/Pressure: Absent     Pelvic:  Cervical exam deferred        Extremities: Normal range of motion.  Edema: Trace  Mental Status: Normal mood and affect. Normal behavior. Normal judgment and thought content.   Urinalysis:      Assessment and Plan:  Pregnancy: G2P1001 at 238w4d1. Supervision of other normal pregnancy, antepartum  2. Eczema, unspecified type - clinically stable  3. History of postpartum depression, currently pregnant   Preterm labor symptoms and general obstetric precautions including but not  limited to vaginal bleeding, contractions, leaking of fluid and fetal movement were reviewed in detail with the patient. Please refer to After Visit Summary for other counseling recommendations.   Return in about 4 weeks (around 09/17/2022) for ROB, 2 hour OGTT.   HaShelly BombardMD 08/20/2022

## 2022-09-17 ENCOUNTER — Encounter: Payer: Self-pay | Admitting: Obstetrics and Gynecology

## 2022-09-17 ENCOUNTER — Ambulatory Visit (INDEPENDENT_AMBULATORY_CARE_PROVIDER_SITE_OTHER): Payer: Medicaid Other | Admitting: Obstetrics and Gynecology

## 2022-09-17 ENCOUNTER — Other Ambulatory Visit (HOSPITAL_COMMUNITY)
Admission: RE | Admit: 2022-09-17 | Discharge: 2022-09-17 | Disposition: A | Discharge status: Home / Self Care | Payer: Medicaid Other | Source: Ambulatory Visit

## 2022-09-17 ENCOUNTER — Other Ambulatory Visit: Payer: Medicaid Other

## 2022-09-17 VITALS — BP 129/79 | HR 90 | Wt 172.2 lb

## 2022-09-17 DIAGNOSIS — O24415 Gestational diabetes mellitus in pregnancy, controlled by oral hypoglycemic drugs: Secondary | ICD-10-CM | POA: Diagnosis not present

## 2022-09-17 DIAGNOSIS — O99012 Anemia complicating pregnancy, second trimester: Secondary | ICD-10-CM

## 2022-09-17 DIAGNOSIS — N898 Other specified noninflammatory disorders of vagina: Secondary | ICD-10-CM

## 2022-09-17 DIAGNOSIS — B3731 Acute candidiasis of vulva and vagina: Secondary | ICD-10-CM

## 2022-09-17 DIAGNOSIS — Z3A27 27 weeks gestation of pregnancy: Secondary | ICD-10-CM | POA: Diagnosis not present

## 2022-09-17 DIAGNOSIS — L309 Dermatitis, unspecified: Secondary | ICD-10-CM

## 2022-09-17 DIAGNOSIS — Z23 Encounter for immunization: Secondary | ICD-10-CM | POA: Diagnosis not present

## 2022-09-17 DIAGNOSIS — O99019 Anemia complicating pregnancy, unspecified trimester: Secondary | ICD-10-CM

## 2022-09-17 DIAGNOSIS — Z3480 Encounter for supervision of other normal pregnancy, unspecified trimester: Secondary | ICD-10-CM

## 2022-09-17 NOTE — Patient Instructions (Signed)

## 2022-09-17 NOTE — Progress Notes (Signed)
Pt presents for ROB visit. Pt c/o abdominal cramps 2 days ago. Pt also c/o vaginal and anal itching. Requesting Rx for albuterol. No other concerns.

## 2022-09-17 NOTE — Progress Notes (Addendum)
   LOW-RISK PREGNANCY OFFICE VISIT Patient name: Jeanette Hall MRN [id number]  Date of birth: 2000-08-31 Chief Complaint:   Routine Prenatal Visit  History of Present Illness:   Jeanette Hall is a 22 y.o. G20P1001 female at 45w4dwith an Estimated Date of Delivery: 12/13/22 being seen today for ongoing management of a low-risk pregnancy.  Today she reports vaginal irritation. She reports continued itching from eczema (previous dx). Contractions: Irritability. Vag. Bleeding: None.  Movement: Present. denies leaking of fluid. Review of Systems:   Pertinent items are noted in HPI Denies abnormal vaginal discharge w/ itching/odor/irritation, headaches, visual changes, shortness of breath, chest pain, abdominal pain, severe nausea/vomiting, or problems with urination or bowel movements unless otherwise stated above. Pertinent History Reviewed:  Reviewed past medical,surgical, social, obstetrical and family history.  Reviewed problem list, medications and allergies. Physical Assessment:   Vitals:   09/17/22 0842  BP: 129/79  Pulse: 90  Weight: 172 lb 3.2 oz (78.1 kg)  Body mass index is 30.03 kg/m.        Physical Examination:   General appearance: Well appearing, and in no distress  Mental status: Alert, oriented to person, place, and time  Skin: Warm & dry  Cardiovascular: Normal heart rate noted  Respiratory: Normal respiratory effort, no distress  Abdomen: Soft, gravid, nontender  Pelvic: Cervical exam deferred         Extremities: Edema: None  Fetal Status: Fetal Heart Rate (bpm): 168 Fundal Height: 29 cm Movement: Present    No results found for this or any previous visit (from the past 24 hour(s)).  Assessment & Plan:  1) Low-risk pregnancy G2P1001 at 223w4dith an Estimated Date of Delivery: 12/13/22   2) Supervision of other normal pregnancy, antepartum - Tdap vaccine greater than or equal to 7yo IM - Glucose Tolerance, 2 Hours w/1 Hour - HIV antibody (with reflex) -  CBC - RPR - Comp Met (CMET) - Bile acids, total  3) Vaginal irritation - Cervicovaginal ancillary only( )  4) Eczema, unspecified type - Bile Acids and CMP to r/o ICP  5) [redacted] weeks gestation of pregnancy     Meds: No orders of the defined types were placed in this encounter.  Labs/procedures today: 3rd trimester labs, 2hr GTT, bile acids and CMP  Plan:  Continue routine obstetrical care   Reviewed: Preterm labor symptoms and general obstetric precautions including but not limited to vaginal bleeding, contractions, leaking of fluid and fetal movement were reviewed in detail with the patient.  All questions were answered. Has home bp cuff. Check bp weekly, let usKoreanow if >140/90.   Follow-up: Return in about 2 weeks (around 10/01/2022) for Return OB visit.  Orders Placed This Encounter  Procedures   Tdap vaccine greater than or equal to 7yo IM   Glucose Tolerance, 2 Hours w/1 Hour   HIV antibody (with reflex)   CBC   RPR   Comp Met (CMET)   Bile acids, total   RoLaury DeepSN, CNM 09/17/2022 12:47 PM

## 2022-09-18 ENCOUNTER — Encounter: Payer: Self-pay | Admitting: Obstetrics and Gynecology

## 2022-09-18 DIAGNOSIS — O99019 Anemia complicating pregnancy, unspecified trimester: Secondary | ICD-10-CM | POA: Insufficient documentation

## 2022-09-18 DIAGNOSIS — O24419 Gestational diabetes mellitus in pregnancy, unspecified control: Secondary | ICD-10-CM | POA: Insufficient documentation

## 2022-09-18 DIAGNOSIS — O24414 Gestational diabetes mellitus in pregnancy, insulin controlled: Secondary | ICD-10-CM | POA: Insufficient documentation

## 2022-09-18 DIAGNOSIS — Z8632 Personal history of gestational diabetes: Secondary | ICD-10-CM | POA: Insufficient documentation

## 2022-09-18 LAB — CERVICOVAGINAL ANCILLARY ONLY
Bacterial Vaginitis (gardnerella): NEGATIVE
Candida Glabrata: NEGATIVE
Candida Vaginitis: POSITIVE — AB
Chlamydia: NEGATIVE
Comment: NEGATIVE
Comment: NEGATIVE
Comment: NEGATIVE
Comment: NEGATIVE
Comment: NEGATIVE
Comment: NORMAL
Neisseria Gonorrhea: NEGATIVE
Trichomonas: NEGATIVE

## 2022-09-18 LAB — COMPREHENSIVE METABOLIC PANEL
ALT: 15 IU/L (ref 0–32)
AST: 19 IU/L (ref 0–40)
Albumin/Globulin Ratio: 1.2 (ref 1.2–2.2)
Albumin: 3.6 g/dL — ABNORMAL LOW (ref 4.0–5.0)
Alkaline Phosphatase: 96 IU/L (ref 44–121)
BUN/Creatinine Ratio: 14 (ref 9–23)
BUN: 8 mg/dL (ref 6–20)
Bilirubin Total: <0.2 mg/dL (ref 0.0–1.2)
CO2: 20 mmol/L (ref 20–29)
Calcium: 9 mg/dL (ref 8.7–10.2)
Chloride: 103 mmol/L (ref 96–106)
Creatinine, Ser: 0.56 mg/dL — ABNORMAL LOW (ref 0.57–1.00)
Globulin, Total: 3 g/dL (ref 1.5–4.5)
Glucose: 113 mg/dL — ABNORMAL HIGH (ref 70–99)
Potassium: 4.6 mmol/L (ref 3.5–5.2)
Sodium: 138 mmol/L (ref 134–144)
Total Protein: 6.6 g/dL (ref 6.0–8.5)
eGFR: 133 mL/min/{1.73_m2} (ref >59)

## 2022-09-18 LAB — CBC
Hematocrit: 32.1 % — ABNORMAL LOW (ref 34.0–46.6)
Hemoglobin: 10.7 g/dL — ABNORMAL LOW (ref 11.1–15.9)
MCH: 28.3 pg (ref 26.6–33.0)
MCHC: 33.3 g/dL (ref 31.5–35.7)
MCV: 85 fL (ref 79–97)
Platelets: 229 10*3/uL (ref 150–450)
RBC: 3.78 x10E6/uL (ref 3.77–5.28)
RDW: 12.7 % (ref 11.7–15.4)
WBC: 8 10*3/uL (ref 3.4–10.8)

## 2022-09-18 LAB — HIV ANTIBODY (ROUTINE TESTING W REFLEX): HIV Screen 4th Generation wRfx: NONREACTIVE

## 2022-09-18 LAB — GLUCOSE TOLERANCE, 2 HOURS W/ 1HR
Glucose, 1 hour: 181 mg/dL — ABNORMAL HIGH (ref 70–179)
Glucose, 2 hour: 118 mg/dL (ref 70–152)
Glucose, Fasting: 117 mg/dL — ABNORMAL HIGH (ref 70–91)

## 2022-09-18 LAB — RPR: RPR Ser Ql: NONREACTIVE

## 2022-09-18 MED ORDER — TERCONAZOLE 0.4 % VA CREA: 1.0000 | TOPICAL_CREAM | Freq: Every day | VAGINAL | 0 refills | Status: DC

## 2022-09-18 MED ORDER — ACCU-CHEK GUIDE W/DEVICE KIT: 1.0000 | PACK | Freq: Four times a day (QID) | 0 refills | Status: DC

## 2022-09-18 MED ORDER — ASCORBIC ACID 500 MG PO TABS: 500.0000 mg | ORAL_TABLET | ORAL | 3 refills | Status: AC

## 2022-09-18 MED ORDER — ACCU-CHEK SOFTCLIX LANCETS MISC: 12 refills | Status: DC

## 2022-09-18 NOTE — Addendum Note (Signed)
Addended by: Graceann Congress on: 09/18/2022 10:01 AM   Modules accepted: Orders

## 2022-09-18 NOTE — Addendum Note (Signed)
Addended by: Graceann Congress on: 09/18/2022 10:02 AM   Modules accepted: Orders

## 2022-09-18 NOTE — Addendum Note (Signed)
Addended by: Graceann Congress on: 09/18/2022 02:51 PM   Modules accepted: Orders

## 2022-09-19 LAB — BILE ACIDS, TOTAL: Bile Acids Total: 2.3 umol/L (ref 0.0–10.0)

## 2022-09-21 ENCOUNTER — Other Ambulatory Visit: Payer: Self-pay | Admitting: Emergency Medicine

## 2022-09-21 DIAGNOSIS — O24415 Gestational diabetes mellitus in pregnancy, controlled by oral hypoglycemic drugs: Secondary | ICD-10-CM

## 2022-09-21 MED ORDER — ACCU-CHEK GUIDE W/DEVICE KIT: 1.0000 | PACK | Freq: Four times a day (QID) | 0 refills | Status: DC

## 2022-09-21 NOTE — Progress Notes (Signed)
Rx resent w/ new instructions

## 2022-09-24 ENCOUNTER — Other Ambulatory Visit: Payer: Self-pay | Admitting: Emergency Medicine

## 2022-09-24 DIAGNOSIS — O24415 Gestational diabetes mellitus in pregnancy, controlled by oral hypoglycemic drugs: Secondary | ICD-10-CM

## 2022-09-24 MED ORDER — ACCU-CHEK SOFTCLIX LANCETS MISC: 12 refills | Status: DC

## 2022-09-24 NOTE — Progress Notes (Signed)
Rx for SoftClix lancets.

## 2022-09-29 ENCOUNTER — Encounter (HOSPITAL_COMMUNITY): Payer: Self-pay | Admitting: Family Medicine

## 2022-09-29 ENCOUNTER — Other Ambulatory Visit (HOSPITAL_COMMUNITY): Payer: Self-pay

## 2022-09-29 ENCOUNTER — Inpatient Hospital Stay (HOSPITAL_COMMUNITY)
Admission: AD | Admit: 2022-09-29 | Discharge: 2022-09-29 | Disposition: A | Discharge status: Home / Self Care | Payer: Medicaid Other | Attending: Family Medicine | Admitting: Family Medicine

## 2022-09-29 DIAGNOSIS — O4703 False labor before 37 completed weeks of gestation, third trimester: Secondary | ICD-10-CM | POA: Insufficient documentation

## 2022-09-29 DIAGNOSIS — O26893 Other specified pregnancy related conditions, third trimester: Secondary | ICD-10-CM | POA: Insufficient documentation

## 2022-09-29 DIAGNOSIS — O219 Vomiting of pregnancy, unspecified: Secondary | ICD-10-CM

## 2022-09-29 DIAGNOSIS — O99013 Anemia complicating pregnancy, third trimester: Secondary | ICD-10-CM | POA: Diagnosis not present

## 2022-09-29 DIAGNOSIS — O212 Late vomiting of pregnancy: Secondary | ICD-10-CM | POA: Diagnosis present

## 2022-09-29 DIAGNOSIS — R197 Diarrhea, unspecified: Secondary | ICD-10-CM | POA: Diagnosis not present

## 2022-09-29 DIAGNOSIS — O99283 Endocrine, nutritional and metabolic diseases complicating pregnancy, third trimester: Secondary | ICD-10-CM | POA: Diagnosis not present

## 2022-09-29 DIAGNOSIS — E860 Dehydration: Secondary | ICD-10-CM | POA: Diagnosis not present

## 2022-09-29 DIAGNOSIS — Z3689 Encounter for other specified antenatal screening: Secondary | ICD-10-CM | POA: Diagnosis not present

## 2022-09-29 DIAGNOSIS — O479 False labor, unspecified: Secondary | ICD-10-CM

## 2022-09-29 DIAGNOSIS — Z3A29 29 weeks gestation of pregnancy: Secondary | ICD-10-CM | POA: Diagnosis not present

## 2022-09-29 DIAGNOSIS — R519 Headache, unspecified: Secondary | ICD-10-CM

## 2022-09-29 DIAGNOSIS — R1030 Lower abdominal pain, unspecified: Secondary | ICD-10-CM | POA: Diagnosis not present

## 2022-09-29 LAB — URINALYSIS, ROUTINE W REFLEX MICROSCOPIC
Bilirubin Urine: NEGATIVE
Glucose, UA: NEGATIVE mg/dL
Hgb urine dipstick: NEGATIVE
Ketones, ur: 20 mg/dL — AB
Nitrite: NEGATIVE
Protein, ur: 30 mg/dL — AB
Specific Gravity, Urine: 1.027 (ref 1.005–1.030)
pH: 6 (ref 5.0–8.0)

## 2022-09-29 LAB — CBC
HCT: 34.4 % — ABNORMAL LOW (ref 36.0–46.0)
Hemoglobin: 10.8 g/dL — ABNORMAL LOW (ref 12.0–15.0)
MCH: 27.3 pg (ref 26.0–34.0)
MCHC: 31.4 g/dL (ref 30.0–36.0)
MCV: 87.1 fL (ref 80.0–100.0)
Platelets: 252 10*3/uL (ref 150–400)
RBC: 3.95 MIL/uL (ref 3.87–5.11)
RDW: 13.5 % (ref 11.5–15.5)
WBC: 10.3 10*3/uL (ref 4.0–10.5)
nRBC: 0 % (ref 0.0–0.2)

## 2022-09-29 LAB — COMPREHENSIVE METABOLIC PANEL
ALT: 14 U/L (ref 0–44)
AST: 22 U/L (ref 15–41)
Albumin: 2.6 g/dL — ABNORMAL LOW (ref 3.5–5.0)
Alkaline Phosphatase: 75 U/L (ref 38–126)
Anion gap: 9 (ref 5–15)
BUN: 11 mg/dL (ref 6–20)
CO2: 22 mmol/L (ref 22–32)
Calcium: 8 mg/dL — ABNORMAL LOW (ref 8.9–10.3)
Chloride: 106 mmol/L (ref 98–111)
Creatinine, Ser: 0.62 mg/dL (ref 0.44–1.00)
GFR, Estimated: >60 mL/min (ref >60)
Glucose, Bld: 84 mg/dL (ref 70–99)
Potassium: 3.5 mmol/L (ref 3.5–5.1)
Sodium: 137 mmol/L (ref 135–145)
Total Bilirubin: 0.6 mg/dL (ref 0.3–1.2)
Total Protein: 6.4 g/dL — ABNORMAL LOW (ref 6.5–8.1)

## 2022-09-29 MED ORDER — PROMETHAZINE HCL 12.5 MG PO TABS
12.5000 mg | ORAL_TABLET | Freq: Four times a day (QID) | ORAL | 0 refills | Status: DC | PRN
  Filled 2022-09-29: qty 20, 5d supply, fill #0

## 2022-09-29 MED ORDER — ONDANSETRON HCL 4 MG/2ML IJ SOLN
4.0000 mg | Freq: Once | INTRAMUSCULAR | Status: AC
  Administered 2022-09-29: 4 mg via INTRAVENOUS
  Filled 2022-09-29: qty 2

## 2022-09-29 MED ORDER — LACTATED RINGERS IV BOLUS
2000.0000 mL | Freq: Once | INTRAVENOUS | Status: AC
  Administered 2022-09-29: 2000 mL via INTRAVENOUS

## 2022-09-29 MED ORDER — ONDANSETRON 4 MG PO TBDP
4.0000 mg | ORAL_TABLET | Freq: Four times a day (QID) | ORAL | 0 refills | Status: DC | PRN
  Filled 2022-09-29: qty 10, 3d supply, fill #0

## 2022-09-29 MED ORDER — SODIUM CHLORIDE 0.9 % IV SOLN
12.5000 mg | Freq: Once | INTRAVENOUS | Status: AC
  Administered 2022-09-29: 12.5 mg via INTRAVENOUS
  Filled 2022-09-29: qty 12.5

## 2022-09-29 MED ORDER — ACETAMINOPHEN-CAFFEINE 500-65 MG PO TABS
2.0000 | ORAL_TABLET | Freq: Once | ORAL | Status: AC
  Administered 2022-09-29: 2 via ORAL
  Filled 2022-09-29: qty 2

## 2022-09-29 NOTE — MAU Note (Signed)
..  Jeanette Hall is a 22 y.o. at [redacted]w[redacted]d here in MAU reporting: around 0230 this morning she started having n/v/d. Unable to count how many episodes of emesis, but x3 episodes of diarrhea. Also reporting headache and irregular "cramps". No VB or LOF. +FM.  Pain score: head- 8     abdominal cramps- 6 Vitals:   09/29/22 1201  BP: 127/61  Pulse: 82  Resp: 14  Temp: 98.2 F (36.8 C)  SpO2: 100%     Lab orders placed from triage:   UA

## 2022-09-29 NOTE — MAU Provider Note (Signed)
History     CSN: [id number]  Arrival date and time: 09/29/22 1132   Event Date/Time   First Provider Initiated Contact with Patient 09/29/22 1212      Chief Complaint  Patient presents with   Emesis   Nausea   Diarrhea   Abdominal Pain   HPI Patient is 22 y.o. G2P1001 [redacted]w[redacted]d here with complaints of abdominal pain with nausea, vomiting, diarrhea. Acute onset at 41. Reports no known sick contacts. She did go out to eat last night at a local [employer] and had chicken and broccoli. Reports lower abdominal cramping. Also has HA and thinks this is related to not being able to eat or drink .  She has been unable to keep down fluids.  She was eating and drinking normally until the vomiting started/   +FM, denies LOF, VB, contractions, vaginal discharge.   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  2130 on 01/26/18   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    Past Surgical History:  Procedure Laterality Date   MASS EXCISION N/A 05/05/2016   Procedure: EXCISION  OF TONGUE MASS;  Surgeon: Leta Baptist, MD;  Location: [place] [place];  Service: ENT;  Laterality: N/A;  EXCISION  OF TONGUE MASS   WISDOM TOOTH EXTRACTION      Family History  Problem Relation Age of Onset   Hypertension Mother    Schizophrenia Brother        schizophrenia spectrum per report   Asthma Maternal Uncle    Hypertension Maternal Grandmother    Asthma Maternal Grandmother    Cancer Maternal Grandmother    Diabetes Maternal Grandmother    Cancer Paternal Grandmother     Social History    Tobacco Use   Smoking status: Never   Smokeless tobacco: Never   Tobacco comments:    stepfather smokes outside  Vaping Use   Vaping Use: Never used  Substance Use Topics   Alcohol use: No   Drug use: Not Currently    Types: Marijuana    Comment: last used 07/01/2022    Allergies:  Allergies  Allergen Reactions   Fish Allergy Anaphylaxis   Shellfish Allergy Anaphylaxis    No medications prior to admission.    Review of Systems  Constitutional:  Negative for chills and fever.  HENT:  Negative for congestion and sore throat.   Eyes:  Negative for pain and visual disturbance.  Respiratory:  Negative for cough, chest tightness and shortness of breath.   Cardiovascular:  Negative for chest pain.  Gastrointestinal:  Negative for abdominal pain, diarrhea, nausea and vomiting.  Endocrine: Negative for cold intolerance and heat intolerance.  Genitourinary:  Negative for dysuria and flank pain.  Musculoskeletal:  Negative for back pain.  Skin:  Negative for rash.  Allergic/Immunologic: Negative for  food allergies.  Neurological:  Negative for dizziness and light-headedness.  Psychiatric/Behavioral:  Negative for agitation.    Physical Exam   Blood pressure (!) 104/44, pulse 61, temperature 98.2 F (36.8 C), temperature source Oral, resp. rate 16, weight 79.9 kg, last menstrual period 02/08/2022, SpO2 100 %.  Physical Exam Vitals and nursing note reviewed.  Constitutional:      General: She is not in acute distress.    Appearance: She is well-developed.     Comments: Pregnant female  HENT:     Head: Normocephalic and atraumatic.     Mouth/Throat:     Lips: Pink.     Mouth: Mucous membranes are dry.  Eyes:     General: No scleral icterus.    Conjunctiva/sclera: Conjunctivae normal.  Cardiovascular:     Rate and Rhythm: Normal rate.  Pulmonary:     Effort: Pulmonary effort is normal.  Chest:     Chest wall: No tenderness.  Abdominal:     General: Abdomen is  flat.     Palpations: Abdomen is soft.     Tenderness: There is no abdominal tenderness. There is no guarding or rebound.     Comments: Gravid  Genitourinary:    Vagina: Normal.  Musculoskeletal:        General: Normal range of motion.     Cervical back: Normal range of motion and neck supple.  Skin:    General: Skin is warm and dry.     Findings: No rash.  Neurological:     Mental Status: She is alert and oriented to person, place, and time.     MAU Course  Procedures I reviewed the patient's fetal monitoring.  Baseline HR: 140 Variability:  moderate Accels:present Decels: none Toco-- irritable but then resolved with hydration A/P: Reactive NST  Reassured regarding fetal status.    MDM: high  This patient presents to the ED for concern of   Chief Complaint  Patient presents with   Emesis   Nausea   Diarrhea   Abdominal Pain     These complains involves an extensive number of treatment options, and is a complaint that carries with it a high risk of complications and morbidity.  The differential diagnosis for  1. Nausea/Vomiting Diarrhea INCLUDES infectious like viral GI, food borne, parasite related  2. Contractions INCLUDES preterm labor, braxton hicks, dehydration related contractions 3. Headache INCLUDES preeclampsia less likely given normal BP, most likely related to dehydration.   Co morbidities that complicate the patient evaluation: A1DM   External records from outside source obtained and reviewed including Prenatal care records  Lab Tests: UA, CMP, and CBC  I ordered, and personally interpreted labs.  The pertinent results include:  UA shows keytones  Imaging Studies ordered: None  Cardiac Testing/Monitoring: None  Medicines ordered and prescription drug management:  Medications: Zofran, Phenergan   Reevaluation of the patient after these medicines showed that the patient improved I have reviewed the patients home medicines and have made adjustments  as needed  Test Considered: Abdominal US- not warranted with no focal exam findings CT scan- no warranted with improved sx with hydration  Critical Interventions: IV fluids   MAU Course: 3:31 PM s/p 2 L and IV antiemetics. Was able to tolerate water and crackers. HA resolved.   After the interventions noted above, I reevaluated the patient and found that they have :improved  Dispostion: discharged   Assessment and Plan   1. Nausea and vomiting of pregnancy, antepartum   2.  [redacted] weeks gestation of pregnancy   3. Diarrhea of presumed infectious origin   4. NST (non-stress test) reactive   5. Irregular uterine contractions   6. Dehydration, mild   7. Anemia during pregnancy in third trimester   8. Pregnancy headache in third trimester    - Improved with interventions, likely food borne. Reviewed typical 12-24 hr of symptoms - Reviewed techniques for staying hydrated - Rx for phenergan and zofran sent to [facility] - Follow up at OP appointment.    Future Appointments  Date Time Provider [place]  09/30/2022  9:00 AM [place] [facility] [place] [facility]  10/02/2022  9:35 AM Wood[place] Mode, MD [facility] None     Allergies as of 09/29/2022       Reactions   Fish Allergy Anaphylaxis   Shellfish Allergy Anaphylaxis        Medication List     STOP taking these medications    Prenatal 28-0.8 MG Tabs   Prenatal Plus Vitamin/Mineral 27-1 MG Tabs   terconazole 0.4 % vaginal cream Commonly known as: TERAZOL 7       TAKE these medications    Accu-Chek Guide w/Device Kit 1 each by Does not apply route 4 (four) times daily. Test your blood sugar when you wake up before breakfast, after breakfast, after lunch & after dinner.   Accu-Chek Softclix Lancets lancets Take Blood Sugar reading 4 times daily. When you wake up before breakfast, after breakfast, after lunch and after dinner.   albuterol 108 (90 Base) MCG/ACT inhaler Commonly known as: VENTOLIN  HFA Inhale 2 puffs into the lungs every 4 (four) hours as needed for wheezing or shortness of breath. Wheezing or shortness of breath   ascorbic acid 500 MG tablet Commonly known as: VITAMIN C Take 1 tablet (500 mg total) by mouth every other day. Take with iron pill   Blood Pressure Kit Devi 1 Device by Does not apply route once a week.   docusate sodium 100 MG capsule Commonly known as: COLACE Take 1 capsule (100 mg total) by mouth 2 (two) times daily as needed.   ferrous sulfate 325 (65 FE) MG tablet Take 1 tablet (325 mg total) by mouth every other day.   folic acid 1 MG tablet Commonly known as: FOLVITE Take 1 mg by mouth daily.   hydrocerin Crea Apply 1 Application topically 3 (three) times daily.   hydrocortisone 2.5 % ointment Apply topically 2 (two) times daily as needed.   loratadine 10 MG tablet Commonly known as: Claritin Take 1 tablet (10 mg total) by mouth daily.   mineral oil-hydrophilic petrolatum ointment Apply topically 2 (two) times daily.   ondansetron 4 MG disintegrating tablet Commonly known as: ZOFRAN-ODT Take 1 tablet (4 mg total) by mouth every 6 (six) hours as needed for nausea.   polyethylene glycol 17 g packet Commonly known as: MiraLax Take 17 g by mouth daily.   Prenate Mini 18-0.6-0.4-350 MG Caps Take 1 capsule by mouth daily before breakfast.   promethazine 12.5 MG tablet Commonly known as: PHENERGAN Take 1 tablet (12.5 mg total) by mouth every 6 (six) hours as needed for nausea or vomiting.   sertraline 25 MG tablet Commonly known as: ZOLOFT Take 1 tablet (25 mg total) by mouth daily.        Juanita Craver [facility] 09/29/2022, 3:31 PM

## 2022-09-30 ENCOUNTER — Encounter: Payer: Self-pay | Admitting: Registered"

## 2022-09-30 ENCOUNTER — Encounter: Payer: Medicaid Other | Attending: Obstetrics and Gynecology | Admitting: Registered"

## 2022-09-30 DIAGNOSIS — O24415 Gestational diabetes mellitus in pregnancy, controlled by oral hypoglycemic drugs: Secondary | ICD-10-CM | POA: Diagnosis not present

## 2022-09-30 DIAGNOSIS — Z3A00 Weeks of gestation of pregnancy not specified: Secondary | ICD-10-CM | POA: Insufficient documentation

## 2022-09-30 DIAGNOSIS — O24419 Gestational diabetes mellitus in pregnancy, unspecified control: Secondary | ICD-10-CM

## 2022-09-30 DIAGNOSIS — Z713 Dietary counseling and surveillance: Secondary | ICD-10-CM | POA: Insufficient documentation

## 2022-09-30 NOTE — Progress Notes (Signed)
The following learning objectives were met by the patient during this course:   States the definition of Gestational Diabetes States why dietary management is important in controlling blood glucose Describes the effects each nutrient has on blood glucose levels Demonstrates ability to create a balanced meal plan Demonstrates carbohydrate counting  States when to check blood glucose levels Demonstrates proper blood glucose monitoring techniques States the effect of stress and exercise on blood glucose levels States the importance of limiting caffeine and abstaining from alcohol and smoking   Blood glucose monitor given: None. Patient has meter and checking blood sugar prior to class.   Patient instructed to monitor glucose levels: FBS: 60 - <95; 1 hour: <140; 2 hour: <120   Patient received handouts: Nutrition Diabetes and Pregnancy, including carb counting list and glucose log sheet   Patient will be seen for follow-up as needed. 

## 2022-10-02 ENCOUNTER — Ambulatory Visit (INDEPENDENT_AMBULATORY_CARE_PROVIDER_SITE_OTHER): Payer: Medicaid Other | Admitting: Obstetrics & Gynecology

## 2022-10-02 VITALS — BP 122/71 | HR 88 | Wt 180.0 lb

## 2022-10-02 DIAGNOSIS — Z3A29 29 weeks gestation of pregnancy: Secondary | ICD-10-CM

## 2022-10-02 DIAGNOSIS — Z3480 Encounter for supervision of other normal pregnancy, unspecified trimester: Secondary | ICD-10-CM

## 2022-10-02 DIAGNOSIS — O24419 Gestational diabetes mellitus in pregnancy, unspecified control: Secondary | ICD-10-CM

## 2022-10-02 NOTE — Progress Notes (Signed)
Pt recently seen in MAU - feeling better today. Pt had appt with N&D this week - started checking sugars this week.

## 2022-10-02 NOTE — Progress Notes (Unsigned)
   PRENATAL VISIT NOTE  Subjective:  Jeanette Hall is a 22 y.o. G2P1001 at [redacted]w[redacted]d being seen today for ongoing prenatal care.  She is currently monitored for the following issues for this high-risk pregnancy and has Hx of tonsillectomy; Vitamin D deficiency; History of suicide attempt; Eczema; Asthma; Anxiety disorder, unspecified; Constipation; Insomnia; Major depressive disorder, recurrent episode, moderate ([place]); Currently pregnant; History of postpartum depression, currently pregnant; Supervision of other normal pregnancy, antepartum; Gestational diabetes mellitus (GDM) in third trimester; and Anemia during pregnancy on their problem list.  Patient reports no complaints.  Contractions: Not present. Vag. Bleeding: None.  Movement: Present. Denies leaking of fluid.   The following portions of the patient's history were reviewed and updated as appropriate: allergies, current medications, past family history, past medical history, past social history, past surgical history and problem list.   Objective:   Vitals:   10/02/22 0940  BP: 122/71  Pulse: 88  Weight: 180 lb (81.6 kg)    Fetal Status: Fetal Heart Rate (bpm): 145   Movement: Present     General:  Alert, oriented and cooperative. Patient is in no acute distress.  Skin: Skin is warm and dry. No rash noted.   Cardiovascular: Normal heart rate noted  Respiratory: Normal respiratory effort, no problems with respiration noted  Abdomen: Soft, gravid, appropriate for gestational age.  Pain/Pressure: Absent     Pelvic: Cervical exam deferred        Extremities: Normal range of motion.     Mental Status: Normal mood and affect. Normal behavior. Normal judgment and thought content.   Assessment and Plan:  Pregnancy: G2P1001 at [redacted]w[redacted]d 1. Gestational diabetes mellitus (GDM) in third trimester, gestational diabetes method of control unspecified Started BG testing and so far in range diet control  2. Supervision of other normal pregnancy,  antepartum F/u US   Preterm labor symptoms and general obstetric precautions including but not limited to vaginal bleeding, contractions, leaking of fluid and fetal movement were reviewed in detail with the patient. Please refer to After Visit Summary for other counseling recommendations.   Return in about 2 weeks (around 10/16/2022).  No future appointments.  Emeterio Reeve, MD

## 2022-10-09 ENCOUNTER — Other Ambulatory Visit (HOSPITAL_COMMUNITY): Payer: Self-pay

## 2022-10-13 ENCOUNTER — Other Ambulatory Visit: Payer: Self-pay

## 2022-10-16 ENCOUNTER — Telehealth: Payer: Self-pay

## 2022-10-16 NOTE — Telephone Encounter (Signed)
Patient called stating that she was having increased pressure yesterday, but has improved today. Patient advised to take tylenol for pain and purchase a maternity support belt. Advised to be evaluated if she experiences any decreased fetal movement, LOF, or bleeding. Patient verbalized understanding. Next appt in 4/8

## 2022-10-19 ENCOUNTER — Other Ambulatory Visit (HOSPITAL_COMMUNITY): Payer: Self-pay

## 2022-10-19 ENCOUNTER — Ambulatory Visit (INDEPENDENT_AMBULATORY_CARE_PROVIDER_SITE_OTHER): Payer: Medicaid Other | Admitting: Obstetrics and Gynecology

## 2022-10-19 VITALS — BP 128/71 | HR 82 | Wt 182.0 lb

## 2022-10-19 DIAGNOSIS — O24414 Gestational diabetes mellitus in pregnancy, insulin controlled: Secondary | ICD-10-CM

## 2022-10-19 DIAGNOSIS — Z3480 Encounter for supervision of other normal pregnancy, unspecified trimester: Secondary | ICD-10-CM

## 2022-10-19 DIAGNOSIS — O99019 Anemia complicating pregnancy, unspecified trimester: Secondary | ICD-10-CM

## 2022-10-19 DIAGNOSIS — O99891 Other specified diseases and conditions complicating pregnancy: Secondary | ICD-10-CM

## 2022-10-19 DIAGNOSIS — O99013 Anemia complicating pregnancy, third trimester: Secondary | ICD-10-CM

## 2022-10-19 DIAGNOSIS — Z3A32 32 weeks gestation of pregnancy: Secondary | ICD-10-CM

## 2022-10-19 DIAGNOSIS — F419 Anxiety disorder, unspecified: Secondary | ICD-10-CM

## 2022-10-19 DIAGNOSIS — D563 Thalassemia minor: Secondary | ICD-10-CM

## 2022-10-19 DIAGNOSIS — Z148 Genetic carrier of other disease: Secondary | ICD-10-CM

## 2022-10-19 DIAGNOSIS — Z8659 Personal history of other mental and behavioral disorders: Secondary | ICD-10-CM

## 2022-10-19 DIAGNOSIS — J45909 Unspecified asthma, uncomplicated: Secondary | ICD-10-CM

## 2022-10-19 MED ORDER — INSULIN PEN NEEDLE 32G X 4 MM MISC
1 refills | Status: DC
  Filled 2022-10-19: qty 100, 100d supply, fill #0
  Filled 2022-10-28: qty 100, 34d supply, fill #0

## 2022-10-19 MED ORDER — INSULIN DETEMIR 100 UNIT/ML FLEXPEN
6.0000 [IU] | Freq: Every day | SUBCUTANEOUS | 0 refills | Status: DC
  Filled 2022-10-19 – 2022-10-27 (×2): qty 15, 250d supply, fill #0
  Filled 2022-10-28: qty 3, 50d supply, fill #0

## 2022-10-19 NOTE — Progress Notes (Signed)
   PRENATAL VISIT NOTE  Subjective:  Jeanette Hall is a 22 y.o. G2P1001 at [redacted]w[redacted]d being seen today for ongoing prenatal care.  She is currently monitored for the following issues for this low-risk pregnancy and has Hx of tonsillectomy; Vitamin D deficiency; History of suicide attempt; Eczema; Asthma; Anxiety disorder, unspecified; Constipation; Insomnia; Major depressive disorder, recurrent episode, moderate; History of postpartum depression, currently pregnant; Supervision of other normal pregnancy, antepartum; Gestational diabetes mellitus (GDM) in third trimester; and Anemia during pregnancy on their problem list.  Patient reports  doing well overall .  Contractions: Irritability. Vag. Bleeding: None.  Movement: Present. Denies leaking of fluid.   The following portions of the patient's history were reviewed and updated as appropriate: allergies, current medications, past family history, past medical history, past social history, past surgical history and problem list.   Objective:   Vitals:   10/19/22 1059  BP: 128/71  Pulse: 82  Weight: 182 lb (82.6 kg)    Fetal Status: Fetal Heart Rate (bpm): 156   Movement: Present     General:  Alert, oriented and cooperative. Patient is in no acute distress.  Skin: Skin is warm and dry. No rash noted.   Cardiovascular: Normal heart rate noted  Respiratory: Normal respiratory effort, no problems with respiration noted  Abdomen: Soft, gravid, appropriate for gestational age.  Pain/Pressure: Present      Assessment and Plan:  Pregnancy: G2P1001 at [redacted]w[redacted]d 1. Supervision of other normal pregnancy, antepartum 2. [redacted] weeks gestation of pregnancy  3. Insulin controlled gestational diabetes mellitus (GDM) in third trimester - Reviewed blood sugars today. Limited data, but has pan elevated fastings > 100 - Discussed insulin vs metformin. Reviewed that insulin is preferred due to better BG control, but that ability to adhere is most important to BG  control. Discussed option for metformin as alternative if patient is unable to consistently administer insulin. She would like to start insulin. 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  - START levemir 6u QHS - Counseled pt that we are starting a low dose, so not to be surprised if further titration is needed. Will send MyChart message in 1 week to follow up - Message routed to scheduling & diabetes education about an insulin teaching appointment - Growth [place] scheduled 4/10, will get BPP at that time. Weekly BPPs to start after that [place]  4. Anemia during pregnancy Hgb 10.8 on 3/19 Continue po iron  5. Uncomplicated asthma, unspecified asthma severity, unspecified whether persistent No respiratory complaints today  6. History of postpartum depression, currently pregnant 7. Anxiety disorder, unspecified type No mood complaints today  8. Carrier of spinal muscular atrophy 9. Alpha thalassemia silent carrier Partner test kit provided today  Return in about 2 weeks (around 11/02/2022) for return OB at 34 weeks.  Future Appointments  Date Time Provider Department Center  10/21/2022  8:30 AM [facility] NURSE [facility]  10/21/2022  8:45 AM [facility]  11/02/2022 10:35 AM Brock Bad, MD [facility] None  11/16/2022 10:35 AM Hermina Staggers, MD [facility] None  11/23/2022 10:35 AM Leftwich-Kirby, Wilmer Floor, CNM [facility] None  11/30/2022 10:35 AM Constant, Gigi Gin, MD [facility] None  12/08/2022 10:35 AM Warden Fillers, MD [facility] None   Lennart Pall, MD

## 2022-10-20 NOTE — Addendum Note (Signed)
Addended by: Harvie Bridge on: 10/20/2022 09:05 AM   Modules accepted: Orders

## 2022-10-21 ENCOUNTER — Other Ambulatory Visit: Payer: Self-pay | Admitting: Obstetrics & Gynecology

## 2022-10-21 ENCOUNTER — Other Ambulatory Visit: Payer: Self-pay | Admitting: *Deleted

## 2022-10-21 ENCOUNTER — Ambulatory Visit: Payer: Medicaid Other | Attending: Obstetrics & Gynecology

## 2022-10-21 ENCOUNTER — Ambulatory Visit: Payer: Medicaid Other | Admitting: *Deleted

## 2022-10-21 VITALS — BP 135/66 | HR 84

## 2022-10-21 DIAGNOSIS — O24414 Gestational diabetes mellitus in pregnancy, insulin controlled: Secondary | ICD-10-CM

## 2022-10-21 DIAGNOSIS — O24419 Gestational diabetes mellitus in pregnancy, unspecified control: Secondary | ICD-10-CM

## 2022-10-21 DIAGNOSIS — F1290 Cannabis use, unspecified, uncomplicated: Secondary | ICD-10-CM | POA: Diagnosis not present

## 2022-10-21 DIAGNOSIS — O99013 Anemia complicating pregnancy, third trimester: Secondary | ICD-10-CM

## 2022-10-21 DIAGNOSIS — Z148 Genetic carrier of other disease: Secondary | ICD-10-CM

## 2022-10-21 DIAGNOSIS — D563 Thalassemia minor: Secondary | ICD-10-CM

## 2022-10-21 DIAGNOSIS — O99323 Drug use complicating pregnancy, third trimester: Secondary | ICD-10-CM | POA: Diagnosis not present

## 2022-10-21 DIAGNOSIS — D649 Anemia, unspecified: Secondary | ICD-10-CM

## 2022-10-21 DIAGNOSIS — O285 Abnormal chromosomal and genetic finding on antenatal screening of mother: Secondary | ICD-10-CM

## 2022-10-21 DIAGNOSIS — Z3A32 32 weeks gestation of pregnancy: Secondary | ICD-10-CM

## 2022-10-23 ENCOUNTER — Ambulatory Visit: Payer: Medicaid Other | Admitting: Registered"

## 2022-10-23 NOTE — Progress Notes (Deleted)
Insulin Instruction  Patient was seen on *** for insulin instruction.   Start: ***     End: ***   MD orders are:   Levemir 6 units qhs  The following learning objectives were met by the patient during this visit:   Insulin Action of Levemir  Reviewed syringe & vial including # units per syringe and vial  Hygiene and storage  Drawing up single dose using vials  Rotation of Sites  Hypoglycemia- symptoms, causes, treatment choices  Record keeping and MD follow up  Patient demonstrated understanding of insulin administration by return demonstration.  Patient received the following handouts: Insulin Instruction Handout                                        Patient to start on insulin as Rx'd by MD

## 2022-10-25 ENCOUNTER — Other Ambulatory Visit (HOSPITAL_COMMUNITY): Payer: Self-pay

## 2022-10-26 ENCOUNTER — Ambulatory Visit: Payer: Medicaid Other | Admitting: Registered"

## 2022-10-27 ENCOUNTER — Other Ambulatory Visit (HOSPITAL_COMMUNITY): Payer: Self-pay

## 2022-10-27 ENCOUNTER — Encounter: Payer: Self-pay | Admitting: Obstetrics and Gynecology

## 2022-10-28 ENCOUNTER — Other Ambulatory Visit (INDEPENDENT_AMBULATORY_CARE_PROVIDER_SITE_OTHER): Payer: Medicaid Other

## 2022-10-28 ENCOUNTER — Other Ambulatory Visit (HOSPITAL_COMMUNITY): Payer: Self-pay

## 2022-10-28 ENCOUNTER — Other Ambulatory Visit: Payer: Self-pay

## 2022-10-28 ENCOUNTER — Ambulatory Visit (INDEPENDENT_AMBULATORY_CARE_PROVIDER_SITE_OTHER): Payer: Medicaid Other | Admitting: General Practice

## 2022-10-28 VITALS — BP 118/75 | HR 79

## 2022-10-28 DIAGNOSIS — O24414 Gestational diabetes mellitus in pregnancy, insulin controlled: Secondary | ICD-10-CM

## 2022-10-28 DIAGNOSIS — Z3A33 33 weeks gestation of pregnancy: Secondary | ICD-10-CM

## 2022-10-28 NOTE — Progress Notes (Signed)
Pt informed that the ultrasound is considered a limited OB ultrasound and is not intended to be a complete ultrasound exam.  Patient also informed that the ultrasound is not being completed with the intent of assessing for fetal or placental anomalies or any pelvic abnormalities.  Explained that the purpose of today's ultrasound is to assess for  BPP, presentation, and AFI.  Patient acknowledges the purpose of the exam and the limitations of the study.   H RN BSN 10/28/22     

## 2022-11-02 ENCOUNTER — Encounter: Payer: Self-pay | Admitting: Obstetrics

## 2022-11-02 ENCOUNTER — Other Ambulatory Visit (HOSPITAL_COMMUNITY): Payer: Self-pay

## 2022-11-02 ENCOUNTER — Ambulatory Visit (INDEPENDENT_AMBULATORY_CARE_PROVIDER_SITE_OTHER): Payer: Medicaid Other | Admitting: Obstetrics

## 2022-11-02 VITALS — BP 113/66 | HR 84 | Wt 186.0 lb

## 2022-11-02 DIAGNOSIS — O0990 Supervision of high risk pregnancy, unspecified, unspecified trimester: Secondary | ICD-10-CM

## 2022-11-02 DIAGNOSIS — O24414 Gestational diabetes mellitus in pregnancy, insulin controlled: Secondary | ICD-10-CM

## 2022-11-02 DIAGNOSIS — O99340 Other mental disorders complicating pregnancy, unspecified trimester: Secondary | ICD-10-CM

## 2022-11-02 DIAGNOSIS — J301 Allergic rhinitis due to pollen: Secondary | ICD-10-CM

## 2022-11-02 DIAGNOSIS — L309 Dermatitis, unspecified: Secondary | ICD-10-CM

## 2022-11-02 DIAGNOSIS — F32A Depression, unspecified: Secondary | ICD-10-CM

## 2022-11-02 MED ORDER — PRENATE MINI 18-0.6-0.4-350 MG PO CAPS
1.0000 | ORAL_CAPSULE | Freq: Every day | ORAL | 4 refills | Status: AC
  Filled 2022-11-02: qty 90, 90d supply, fill #0

## 2022-11-02 MED ORDER — SERTRALINE HCL 25 MG PO TABS
25.0000 mg | ORAL_TABLET | Freq: Every day | ORAL | 2 refills | Status: AC
  Filled 2022-11-02 – 2023-04-19 (×2): qty 30, 30d supply, fill #0
  Filled 2023-05-18: qty 30, 30d supply, fill #1

## 2022-11-02 MED ORDER — HYDROCORTISONE 2.5 % EX OINT
TOPICAL_OINTMENT | Freq: Two times a day (BID) | CUTANEOUS | 2 refills | Status: DC | PRN
  Filled 2022-11-02: qty 28.35, 12d supply, fill #0

## 2022-11-02 MED ORDER — LORATADINE 10 MG PO TABS
10.0000 mg | ORAL_TABLET | Freq: Every day | ORAL | 11 refills | Status: AC
  Filled 2022-11-02: qty 30, 30d supply, fill #0

## 2022-11-02 NOTE — Progress Notes (Signed)
Subjective:  Jeanette Hall is a 22 y.o. G2P1001 at [redacted]w[redacted]d being seen today for ongoing prenatal care.  She is currently monitored for the following issues for this high-risk pregnancy and has Hx of tonsillectomy; Vitamin D deficiency; History of suicide attempt; Eczema; Asthma; Anxiety disorder, unspecified; Constipation; Insomnia; Major depressive disorder, recurrent episode, moderate; History of postpartum depression, currently pregnant; Supervision of other normal pregnancy, antepartum; Insulin controlled gestational diabetes mellitus (GDM) during pregnancy; and Anemia during pregnancy on their problem list.  Patient reports no complaints.  Contractions: Irritability. Vag. Bleeding: None.  Movement: Present. Denies leaking of fluid.   The following portions of the patient's history were reviewed and updated as appropriate: allergies, current medications, past family history, past medical history, past social history, past surgical history and problem list. Problem list updated.  Objective:   Vitals:   11/02/22 1057  BP: 113/66  Pulse: 84  Weight: 186 lb (84.4 kg)    Fetal Status:     Movement: Present     General:  Alert, oriented and cooperative. Patient is in no acute distress.  Skin: Skin is warm and dry. No rash noted.   Cardiovascular: Normal heart rate noted  Respiratory: Normal respiratory effort, no problems with respiration noted  Abdomen: Soft, gravid, appropriate for gestational age. Pain/Pressure: Present     Pelvic:  Cervical exam deferred        Extremities: Normal range of motion.  Edema: None  Mental Status: Normal mood and affect. Normal behavior. Normal judgment and thought content.   Urinalysis:      Assessment and Plan:  Pregnancy: G2P1001 at [redacted]w[redacted]d  1. Supervision of high risk pregnancy, antepartum Rx: - Prenat-FeCbn-FeAsp-Meth-FA-DHA (PRENATE MINI) 18-0.6-0.4-350 MG CAPS; Take 1 capsule by mouth daily before breakfast.  Dispense: 90 capsule; Refill: 4  2.  Insulin controlled gestational diabetes mellitus (GDM) in third trimester - good glucose control:  FBS's < 80 and 2 hr PP < 120  3. Seasonal allergic rhinitis due to pollen Rx: - loratadine (CLARITIN) 10 MG tablet; Take 1 tablet (10 mg total) by mouth daily.  Dispense: 30 tablet; Refill: 11  4. Depression affecting pregnancy Rx: - sertraline (ZOLOFT) 25 MG tablet; Take 1 tablet (25 mg total) by mouth daily.  Dispense: 30 tablet; Refill: 2  5. Dermatitis Rx: - hydrocortisone 2.5 % ointment; Apply topically 2 (two) times daily as needed.  Dispense: 28.35 g; Refill: 2    Preterm labor symptoms and general obstetric precautions including but not limited to vaginal bleeding, contractions, leaking of fluid and fetal movement were reviewed in detail with the patient. Please refer to After Visit Summary for other counseling recommendations.   Return in about 2 weeks (around 11/16/2022) for [facility].   Brock Bad, MD

## 2022-11-02 NOTE — Progress Notes (Addendum)
Pt presents for ROB. Having trouble falling asleep and staying asleep.  Experiencing abdominal cramping, pressure. Blood Sugars running 125 post prandial, Fasting BS 85. Taking insulin as prescribed.  Needs refill for zoloft, claritin and pnv and hydrocortisone cream.

## 2022-11-03 ENCOUNTER — Other Ambulatory Visit (HOSPITAL_COMMUNITY): Payer: Self-pay

## 2022-11-05 ENCOUNTER — Other Ambulatory Visit (INDEPENDENT_AMBULATORY_CARE_PROVIDER_SITE_OTHER): Payer: Medicaid Other

## 2022-11-05 ENCOUNTER — Other Ambulatory Visit: Payer: Self-pay

## 2022-11-05 ENCOUNTER — Ambulatory Visit: Payer: Medicaid Other

## 2022-11-05 VITALS — BP 116/69 | HR 76

## 2022-11-05 DIAGNOSIS — Z3A34 34 weeks gestation of pregnancy: Secondary | ICD-10-CM

## 2022-11-05 DIAGNOSIS — O24414 Gestational diabetes mellitus in pregnancy, insulin controlled: Secondary | ICD-10-CM | POA: Diagnosis not present

## 2022-11-05 NOTE — Progress Notes (Signed)

## 2022-11-07 ENCOUNTER — Inpatient Hospital Stay (HOSPITAL_BASED_OUTPATIENT_CLINIC_OR_DEPARTMENT_OTHER): Payer: Medicaid Other

## 2022-11-07 ENCOUNTER — Other Ambulatory Visit: Payer: Self-pay

## 2022-11-07 ENCOUNTER — Encounter (HOSPITAL_COMMUNITY): Payer: Self-pay | Admitting: Obstetrics and Gynecology

## 2022-11-07 ENCOUNTER — Other Ambulatory Visit (HOSPITAL_COMMUNITY): Payer: Self-pay

## 2022-11-07 ENCOUNTER — Inpatient Hospital Stay (HOSPITAL_COMMUNITY)
Admission: AD | Admit: 2022-11-07 | Discharge: 2022-11-07 | Disposition: A | Discharge status: Home / Self Care | Payer: Medicaid Other | Attending: Obstetrics and Gynecology | Admitting: Obstetrics and Gynecology

## 2022-11-07 DIAGNOSIS — Z3A34 34 weeks gestation of pregnancy: Secondary | ICD-10-CM

## 2022-11-07 DIAGNOSIS — R109 Unspecified abdominal pain: Secondary | ICD-10-CM | POA: Insufficient documentation

## 2022-11-07 DIAGNOSIS — F1290 Cannabis use, unspecified, uncomplicated: Secondary | ICD-10-CM

## 2022-11-07 DIAGNOSIS — O4693 Antepartum hemorrhage, unspecified, third trimester: Secondary | ICD-10-CM

## 2022-11-07 DIAGNOSIS — O26893 Other specified pregnancy related conditions, third trimester: Secondary | ICD-10-CM | POA: Insufficient documentation

## 2022-11-07 DIAGNOSIS — B9689 Other specified bacterial agents as the cause of diseases classified elsewhere: Secondary | ICD-10-CM

## 2022-11-07 DIAGNOSIS — O99323 Drug use complicating pregnancy, third trimester: Secondary | ICD-10-CM

## 2022-11-07 DIAGNOSIS — R030 Elevated blood-pressure reading, without diagnosis of hypertension: Secondary | ICD-10-CM | POA: Diagnosis not present

## 2022-11-07 DIAGNOSIS — Z3689 Encounter for other specified antenatal screening: Secondary | ICD-10-CM | POA: Diagnosis not present

## 2022-11-07 DIAGNOSIS — D563 Thalassemia minor: Secondary | ICD-10-CM

## 2022-11-07 DIAGNOSIS — O99013 Anemia complicating pregnancy, third trimester: Secondary | ICD-10-CM

## 2022-11-07 DIAGNOSIS — O285 Abnormal chromosomal and genetic finding on antenatal screening of mother: Secondary | ICD-10-CM

## 2022-11-07 DIAGNOSIS — O23593 Infection of other part of genital tract in pregnancy, third trimester: Secondary | ICD-10-CM | POA: Insufficient documentation

## 2022-11-07 DIAGNOSIS — Z148 Genetic carrier of other disease: Secondary | ICD-10-CM

## 2022-11-07 DIAGNOSIS — O24414 Gestational diabetes mellitus in pregnancy, insulin controlled: Secondary | ICD-10-CM | POA: Diagnosis not present

## 2022-11-07 DIAGNOSIS — O26853 Spotting complicating pregnancy, third trimester: Secondary | ICD-10-CM | POA: Diagnosis not present

## 2022-11-07 DIAGNOSIS — D649 Anemia, unspecified: Secondary | ICD-10-CM

## 2022-11-07 HISTORY — DX: Gestational diabetes mellitus in pregnancy, unspecified control: O24.419

## 2022-11-07 LAB — COMPREHENSIVE METABOLIC PANEL
ALT: 17 U/L (ref 0–44)
AST: 20 U/L (ref 15–41)
Albumin: 2.6 g/dL — ABNORMAL LOW (ref 3.5–5.0)
Alkaline Phosphatase: 91 U/L (ref 38–126)
Anion gap: 7 (ref 5–15)
BUN: 8 mg/dL (ref 6–20)
CO2: 21 mmol/L — ABNORMAL LOW (ref 22–32)
Calcium: 8.6 mg/dL — ABNORMAL LOW (ref 8.9–10.3)
Chloride: 106 mmol/L (ref 98–111)
Creatinine, Ser: 0.62 mg/dL (ref 0.44–1.00)
GFR, Estimated: >60 mL/min (ref >60)
Glucose, Bld: 93 mg/dL (ref 70–99)
Potassium: 3.7 mmol/L (ref 3.5–5.1)
Sodium: 134 mmol/L — ABNORMAL LOW (ref 135–145)
Total Bilirubin: 0.6 mg/dL (ref 0.3–1.2)
Total Protein: 6.3 g/dL — ABNORMAL LOW (ref 6.5–8.1)

## 2022-11-07 LAB — URINALYSIS, ROUTINE W REFLEX MICROSCOPIC
Bilirubin Urine: NEGATIVE
Glucose, UA: NEGATIVE mg/dL
Ketones, ur: NEGATIVE mg/dL
Nitrite: NEGATIVE
Protein, ur: NEGATIVE mg/dL
Specific Gravity, Urine: 1.009 (ref 1.005–1.030)
pH: 7 (ref 5.0–8.0)

## 2022-11-07 LAB — CBC
HCT: 31.8 % — ABNORMAL LOW (ref 36.0–46.0)
Hemoglobin: 10.4 g/dL — ABNORMAL LOW (ref 12.0–15.0)
MCH: 27.1 pg (ref 26.0–34.0)
MCHC: 32.7 g/dL (ref 30.0–36.0)
MCV: 82.8 fL (ref 80.0–100.0)
Platelets: 242 10*3/uL (ref 150–400)
RBC: 3.84 MIL/uL — ABNORMAL LOW (ref 3.87–5.11)
RDW: 14 % (ref 11.5–15.5)
WBC: 8.2 10*3/uL (ref 4.0–10.5)
nRBC: 0 % (ref 0.0–0.2)

## 2022-11-07 LAB — WET PREP, GENITAL
Sperm: NONE SEEN
Trich, Wet Prep: NONE SEEN
WBC, Wet Prep HPF POC: >=10 — AB (ref <10)
Yeast Wet Prep HPF POC: NONE SEEN

## 2022-11-07 LAB — PROTEIN / CREATININE RATIO, URINE
Creatinine, Urine: 59 mg/dL
Protein Creatinine Ratio: 0.17 mg/mg{Cre} — ABNORMAL HIGH (ref 0.00–0.15)
Total Protein, Urine: 10 mg/dL

## 2022-11-07 MED ORDER — METRONIDAZOLE 500 MG PO TABS
500.0000 mg | ORAL_TABLET | Freq: Two times a day (BID) | ORAL | 0 refills | Status: DC
  Filled 2022-11-07: qty 14, 7d supply, fill #0

## 2022-11-07 NOTE — MAU Provider Note (Signed)
History     CSN: [id number]  Arrival date and time: 11/07/22 0104   Event Date/Time   First Provider Initiated Contact with Patient 11/07/22 0154      Chief Complaint  Patient presents with   Vaginal Bleeding   Jeanette Hall is a 22 y.o. G2P1001 at [redacted]w[redacted]d who receives care at [facility].  She presents today for vaginal bleeding.  She states she was using the bathroom (around 0010) and noted some blood in the toilet and on the tissue with wiping.  She states it was initially red, but is getting lighter.  She reports some abdominal cramping, in the lower abdominal area, but denies contractions.  She also denies issues with urination, constipation, diarrhea, or recent sexual activity.  She endorses fetal movement.  She does report increased activity today as she has been preparing for her baby shower.    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  2130 on 01/26/18   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    Past Surgical History:  Procedure Laterality Date   MASS EXCISION N/A 05/05/2016   Procedure: EXCISION  OF TONGUE MASS;  Surgeon: Newman Pies, MD;  Location: [facility];  Service: ENT;  Laterality: N/A;  EXCISION  OF TONGUE MASS   WISDOM TOOTH EXTRACTION      Family History  Problem Relation Age of Onset   Hypertension Mother    Schizophrenia Brother        schizophrenia spectrum per report   Asthma Maternal Uncle    Hypertension Maternal Grandmother    Asthma Maternal Grandmother    Cancer Maternal Grandmother    Diabetes Maternal  Grandmother    Cancer Paternal Grandmother     Social History   Tobacco Use   Smoking status: Never   Smokeless tobacco: Never   Tobacco comments:    stepfather smokes outside  Vaping Use   Vaping Use: Never used  Substance Use Topics   Alcohol use: No   Drug use: Not Currently    Types: Marijuana    Comment: last used 07/01/2022    Allergies:  Allergies  Allergen Reactions   Fish Allergy Anaphylaxis   Shellfish Allergy Anaphylaxis    Medications Prior to Admission  Medication Sig Dispense Refill Last Dose   Insulin Pen Needle 32G X 4 MM MISC Use one pen needle to injection insulin nightly 100 each 1 11/06/2022   loratadine (CLARITIN) 10 MG tablet Take 1 tablet (10 mg total) by mouth daily. 30 tablet 11 11/06/2022   Prenat-FeCbn-FeAsp-Meth-FA-DHA (PRENATE MINI) 18-0.6-0.4-350 MG CAPS Take 1 capsule by mouth daily before breakfast. 90 capsule 4 11/06/2022   sertraline (ZOLOFT) 25 MG tablet Take 1 tablet (25 mg total) by mouth daily. 30 tablet 2 11/06/2022   Accu-Chek Softclix  Lancets lancets Take Blood Sugar reading 4 times daily. When you wake up before breakfast, after breakfast, after lunch and after dinner. 100 each 12    albuterol (VENTOLIN HFA) 108 (90 Base) MCG/ACT inhaler INHALE 2 PUFFS INTO THE LUNGS EVERY 4 HOURS AS NEEDED FOR WHEEZING OR SHORTNESS OF BREATH 18 g 2    ascorbic acid (VITAMIN C) 500 MG tablet Take 1 tablet (500 mg total) by mouth every other day. Take with iron pill 30 tablet 3    Blood Glucose Monitoring Suppl (ACCU-CHEK GUIDE) w/Device KIT 1 each by Does not apply route 4 (four) times daily. Test your blood sugar when you wake up before breakfast, after breakfast, after lunch & after dinner. (Patient not taking: Reported on 10/19/2022) 1 kit 0    Blood Pressure Monitoring (BLOOD PRESSURE KIT) DEVI 1 Device by Does not apply route once a week. 1 each 0    docusate sodium (COLACE) 100 MG capsule Take 1 capsule (100 mg total) by mouth 2 (two) times daily as  needed. (Patient not taking: Reported on 11/02/2022) 30 capsule 2    ferrous sulfate 325 (65 FE) MG tablet Take 1 tablet (325 mg total) by mouth every other day. 60 tablet 5    folic acid (FOLVITE) 1 MG tablet Take 1 mg by mouth daily.      hydrocerin (EUCERIN) CREA Apply 1 Application topically 3 (three) times daily. 454 g 0    hydrocortisone 2.5 % ointment Apply topically 2 (two) times daily as needed. 28.35 g 2    insulin detemir (LEVEMIR) 100 unit/ml SOLN Inject 6 Units into the skin at bedtime. 15 mL 0    mineral oil-hydrophilic petrolatum (AQUAPHOR) ointment Apply topically 2 (two) times daily. (Patient not taking: Reported on 09/17/2022) 420 g 0    ondansetron (ZOFRAN-ODT) 4 MG disintegrating tablet Take 1 tablet (4 mg total) by mouth every 6 (six) hours as needed for nausea. (Patient not taking: Reported on 11/02/2022) 10 tablet 0    polyethylene glycol (MIRALAX) 17 g packet Take 17 g by mouth daily. (Patient not taking: Reported on 09/17/2022) 14 each 0    promethazine (PHENERGAN) 12.5 MG tablet Take 1 tablet (12.5 mg total) by mouth every 6 (six) hours as needed for nausea or vomiting. (Patient not taking: Reported on 10/19/2022) 20 tablet 0     Review of Systems  Genitourinary:  Positive for vaginal bleeding. Negative for difficulty urinating, dysuria and vaginal discharge.   Physical Exam   Blood pressure (!) 133/90, pulse (!) 109, temperature 98 F (36.7 C), temperature source Oral, resp. rate 18, height 5\' 4"  (1.626 m), weight 84.8 kg, last menstrual period 02/08/2022, SpO2 100 %. Vitals:   11/07/22 0117 11/07/22 0155 11/07/22 0300  BP: (!) 133/90 125/71 119/71    Physical Exam Vitals reviewed. Exam conducted with a chaperone present.  Constitutional:      Appearance: Normal appearance.  HENT:     Head: Normocephalic and atraumatic.  Eyes:     Conjunctiva/sclera: Conjunctivae normal.  Cardiovascular:     Rate and Rhythm: Normal rate.  Pulmonary:     Effort: Pulmonary effort is  normal. No respiratory distress.  Abdominal:     Palpations: Abdomen is soft.     Tenderness: There is no abdominal tenderness.  Genitourinary:    Comments: Speculum Exam: -Normal External Genitalia: Non tender, no apparent discharge at introitus.  -Vaginal Vault: Pink mucosa with good rugae. Small amt brownish red discharge -wet prep collected -Cervix:Pink, no  lesions, cysts, or polyps.  Appears open. No active bleeding from os-GC/CT collected -Bimanual Exam: Dilation: 1 Effacement (%): 50 Exam by:: Gerrit Heck, CNM Musculoskeletal:     Cervical back: Normal range of motion.  Skin:    General: Skin is warm and dry.     Comments: Eczema  Neurological:     Mental Status: She is alert.  Psychiatric:        Mood and Affect: Mood normal.     Fetal Assessment 140 bpm, Mod Var, -Decels, +Accels Toco: No ctx graphed  MAU Course   Results for orders placed or performed during the hospital encounter of 11/07/22 (from the past 24 hour(s))  Wet prep, genital     Status: Abnormal   Collection Time: 11/07/22  1:28 AM   Specimen: Urine, Clean Catch  Result Value Ref Range   Yeast Wet Prep HPF POC NONE SEEN NONE SEEN   Trich, Wet Prep NONE SEEN NONE SEEN   Clue Cells Wet Prep HPF POC PRESENT (A) NONE SEEN   WBC, Wet Prep HPF POC >=10 (A) <10   Sperm NONE SEEN   Urinalysis, Routine w reflex microscopic -Urine, Clean Catch     Status: Abnormal   Collection Time: 11/07/22  1:32 AM  Result Value Ref Range   Color, Urine YELLOW YELLOW   APPearance HAZY (A) CLEAR   Specific Gravity, Urine 1.009 1.005 - 1.030   pH 7.0 5.0 - 8.0   Glucose, UA NEGATIVE NEGATIVE mg/dL   Hgb urine dipstick LARGE (A) NEGATIVE   Bilirubin Urine NEGATIVE NEGATIVE   Ketones, ur NEGATIVE NEGATIVE mg/dL   Protein, ur NEGATIVE NEGATIVE mg/dL   Nitrite NEGATIVE NEGATIVE   Leukocytes,Ua SMALL (A) NEGATIVE   RBC / HPF 6-10 0 - 5 RBC/hpf   WBC, UA 0-5 0 - 5 WBC/hpf   Bacteria, UA FEW (A) NONE SEEN   Squamous  Epithelial / HPF 6-10 0 - 5 /HPF  Protein / creatinine ratio, urine     Status: Abnormal   Collection Time: 11/07/22  1:32 AM  Result Value Ref Range   Creatinine, Urine 59 mg/dL   Total Protein, Urine 10 mg/dL   Protein Creatinine Ratio 0.17 (H) 0.00 - 0.15 mg/mg[Cre]  Comprehensive metabolic panel     Status: Abnormal   Collection Time: 11/07/22  2:00 AM  Result Value Ref Range   Sodium 134 (L) 135 - 145 mmol/L   Potassium 3.7 3.5 - 5.1 mmol/L   Chloride 106 98 - 111 mmol/L   CO2 21 (L) 22 - 32 mmol/L   Glucose, Bld 93 70 - 99 mg/dL   BUN 8 6 - 20 mg/dL   Creatinine, Ser 1.61 0.44 - 1.00 mg/dL   Calcium 8.6 (L) 8.9 - 10.3 mg/dL   Total Protein 6.3 (L) 6.5 - 8.1 g/dL   Albumin 2.6 (L) 3.5 - 5.0 g/dL   AST 20 15 - 41 U/L   ALT 17 0 - 44 U/L   Alkaline Phosphatase 91 38 - 126 U/L   Total Bilirubin 0.6 0.3 - 1.2 mg/dL   GFR, Estimated >09 >60 mL/min   Anion gap 7 5 - 15  CBC     Status: Abnormal   Collection Time: 11/07/22  2:00 AM  Result Value Ref Range   WBC 8.2 4.0 - 10.5 K/uL   RBC 3.84 (L) 3.87 - 5.11 MIL/uL   Hemoglobin 10.4 (L) 12.0 - 15.0 g/dL   HCT 45.4 (L) 09.8 - 11.9 %  MCV 82.8 80.0 - 100.0 fL   MCH 27.1 26.0 - 34.0 pg   MCHC 32.7 30.0 - 36.0 g/dL   RDW 16.1 09.6 - 04.5 %   Platelets 242 150 - 400 K/uL   nRBC 0.0 0.0 - 0.2 %   No results found.  MDM PE Wet Prep, GC/CT Labs: UA, CBC, CMP, PC Ratio EFM Ultrasound Prescription Assessment and Plan  22 year old G2P1001  SIUP at 34.6 weeks Cat I FT Vaginal Bleeding  -POC reviewed -Exam performed and findings discussed. -Cultures collected. -Will send for [place] and await results. -NST reactive.    Cherre Robins MSN, CNM 11/07/2022, 1:54 AM   Reassessment (2:54 AM) -Wet prep returns with clue cells. Will treat for BV considering bleeding. -Prelim [place] returns without significant findings. -Labs return without significant findings.  -Rx for metronidazole sent to pharmacy on file.  -Precautions  reviewed. -Encouraged to call primary office or return to MAU if symptoms worsen or with the onset of new symptoms. -Discharged to home in stable condition.  Cherre Robins MSN, CNM Advanced Practice Provider, [facility] [employer]

## 2022-11-07 NOTE — MAU Note (Addendum)
.  Jeanette Hall is a 22 y.o. at [redacted]w[redacted]d here in MAU reporting: light spotting that started at 0010 - noticed blood in toilet and then when she wiped. Denies LOF or PIH symptoms. Reports mild cramping "here and there". +FM. No recent intercourse.   Onset of complaint: 0010 Pain score: 1 Vitals:   11/07/22 0117  BP: (!) 133/90  Pulse: (!) 109  Resp: 18  Temp: 98 F (36.7 C)  SpO2: 100%     FHT:150 Lab orders placed from triage:  UA

## 2022-11-09 LAB — GC/CHLAMYDIA PROBE AMP (~~LOC~~) NOT AT ARMC
Chlamydia: NEGATIVE
Comment: NEGATIVE
Comment: NORMAL
Neisseria Gonorrhea: NEGATIVE

## 2022-11-11 ENCOUNTER — Other Ambulatory Visit: Payer: Self-pay

## 2022-11-11 ENCOUNTER — Other Ambulatory Visit (HOSPITAL_COMMUNITY): Payer: Self-pay

## 2022-11-11 ENCOUNTER — Ambulatory Visit (INDEPENDENT_AMBULATORY_CARE_PROVIDER_SITE_OTHER): Payer: Medicaid Other | Admitting: General Practice

## 2022-11-11 VITALS — BP 108/59 | HR 76 | Wt 186.4 lb

## 2022-11-11 DIAGNOSIS — Z3A35 35 weeks gestation of pregnancy: Secondary | ICD-10-CM

## 2022-11-11 DIAGNOSIS — O24414 Gestational diabetes mellitus in pregnancy, insulin controlled: Secondary | ICD-10-CM

## 2022-11-11 NOTE — Progress Notes (Signed)
Pt informed that the ultrasound is considered a limited OB ultrasound and is not intended to be a complete ultrasound exam.  Patient also informed that the ultrasound is not being completed with the intent of assessing for fetal or placental anomalies or any pelvic abnormalities.  Explained that the purpose of today's ultrasound is to assess for  BPP, presentation, and AFI.  Patient acknowledges the purpose of the exam and the limitations of the study.      H RN BSN 11/11/22  

## 2022-11-16 ENCOUNTER — Other Ambulatory Visit (HOSPITAL_COMMUNITY): Payer: Self-pay

## 2022-11-16 ENCOUNTER — Encounter: Payer: Self-pay | Admitting: Obstetrics and Gynecology

## 2022-11-16 ENCOUNTER — Ambulatory Visit (INDEPENDENT_AMBULATORY_CARE_PROVIDER_SITE_OTHER): Payer: Medicaid Other | Admitting: Obstetrics and Gynecology

## 2022-11-16 VITALS — BP 113/72 | HR 91 | Wt 191.0 lb

## 2022-11-16 DIAGNOSIS — O99019 Anemia complicating pregnancy, unspecified trimester: Secondary | ICD-10-CM

## 2022-11-16 DIAGNOSIS — Z3480 Encounter for supervision of other normal pregnancy, unspecified trimester: Secondary | ICD-10-CM

## 2022-11-16 DIAGNOSIS — O24414 Gestational diabetes mellitus in pregnancy, insulin controlled: Secondary | ICD-10-CM

## 2022-11-16 NOTE — Progress Notes (Signed)
Subjective:  Jeanette Hall is a 22 y.o. G2P1001 at [redacted]w[redacted]d being seen today for ongoing prenatal care.  She is currently monitored for the following issues for this high-risk pregnancy and has Hx of tonsillectomy; Vitamin D deficiency; History of suicide attempt; Eczema; Asthma; Anxiety disorder, unspecified; Constipation; Insomnia; Major depressive disorder, recurrent episode, moderate (HCC); History of postpartum depression, currently pregnant; Supervision of other normal pregnancy, antepartum; Insulin controlled gestational diabetes mellitus (GDM) during pregnancy; and Anemia during pregnancy on their problem list.  Patient reports no complaints.  Contractions: Not present. Vag. Bleeding: None.  Movement: Present. Denies leaking of fluid.   The following portions of the patient's history were reviewed and updated as appropriate: allergies, current medications, past family history, past medical history, past social history, past surgical history and problem list. Problem list updated.  Objective:   Vitals:   11/16/22 1025  BP: 113/72  Pulse: 91  Weight: 191 lb (86.6 kg)    Fetal Status: Fetal Heart Rate (bpm): 140   Movement: Present     General:  Alert, oriented and cooperative. Patient is in no acute distress.  Skin: Skin is warm and dry. No rash noted.   Cardiovascular: Normal heart rate noted  Respiratory: Normal respiratory effort, no problems with respiration noted  Abdomen: Soft, gravid, appropriate for gestational age. Pain/Pressure: Present     Pelvic:  Cervical exam performed        Extremities: Normal range of motion.     Mental Status: Normal mood and affect. Normal behavior. Normal judgment and thought content.   Urinalysis:      Assessment and Plan:  Pregnancy: G2P1001 at [redacted]w[redacted]d  1. Supervision of other normal pregnancy, antepartum Stable Labor precautions - Culture, beta strep (group b only)  2. Insulin controlled gestational diabetes mellitus (GDM) in third  trimester Not check CBG's Taking Lantus qhs Growth scan this week Due to possible uncontrolled DM will schedule IOL between 37-38 weeks  Discussed indications for IOL due to uncontrolled GDM  3. Anemia during pregnancy Continue with iron supplememt  Preterm labor symptoms and general obstetric precautions including but not limited to vaginal bleeding, contractions, leaking of fluid and fetal movement were reviewed in detail with the patient. Please refer to After Visit Summary for other counseling recommendations.  Return in about 1 week (around 11/23/2022) for OB visit, face to face, MD only.   Hermina Staggers, MD

## 2022-11-17 ENCOUNTER — Encounter: Payer: Self-pay | Admitting: *Deleted

## 2022-11-18 ENCOUNTER — Other Ambulatory Visit: Payer: Self-pay | Admitting: Advanced Practice Midwife

## 2022-11-18 ENCOUNTER — Ambulatory Visit: Payer: Medicaid Other | Attending: Maternal & Fetal Medicine

## 2022-11-18 ENCOUNTER — Ambulatory Visit: Payer: Medicaid Other | Admitting: *Deleted

## 2022-11-18 VITALS — BP 126/86 | HR 114

## 2022-11-18 DIAGNOSIS — Z148 Genetic carrier of other disease: Secondary | ICD-10-CM

## 2022-11-18 DIAGNOSIS — O24414 Gestational diabetes mellitus in pregnancy, insulin controlled: Secondary | ICD-10-CM | POA: Insufficient documentation

## 2022-11-18 DIAGNOSIS — O99323 Drug use complicating pregnancy, third trimester: Secondary | ICD-10-CM

## 2022-11-18 DIAGNOSIS — D563 Thalassemia minor: Secondary | ICD-10-CM

## 2022-11-18 DIAGNOSIS — O99013 Anemia complicating pregnancy, third trimester: Secondary | ICD-10-CM | POA: Diagnosis not present

## 2022-11-18 DIAGNOSIS — D649 Anemia, unspecified: Secondary | ICD-10-CM

## 2022-11-18 DIAGNOSIS — F1290 Cannabis use, unspecified, uncomplicated: Secondary | ICD-10-CM

## 2022-11-18 DIAGNOSIS — O285 Abnormal chromosomal and genetic finding on antenatal screening of mother: Secondary | ICD-10-CM

## 2022-11-18 DIAGNOSIS — Z3A36 36 weeks gestation of pregnancy: Secondary | ICD-10-CM

## 2022-11-20 LAB — CULTURE, BETA STREP (GROUP B ONLY): Strep Gp B Culture: NEGATIVE

## 2022-11-23 ENCOUNTER — Ambulatory Visit (INDEPENDENT_AMBULATORY_CARE_PROVIDER_SITE_OTHER): Payer: Medicaid Other | Admitting: Advanced Practice Midwife

## 2022-11-23 VITALS — BP 129/75 | HR 82 | Wt 191.6 lb

## 2022-11-23 DIAGNOSIS — Z3480 Encounter for supervision of other normal pregnancy, unspecified trimester: Secondary | ICD-10-CM

## 2022-11-23 DIAGNOSIS — Z3A37 37 weeks gestation of pregnancy: Secondary | ICD-10-CM

## 2022-11-23 DIAGNOSIS — O24414 Gestational diabetes mellitus in pregnancy, insulin controlled: Secondary | ICD-10-CM

## 2022-11-23 NOTE — Progress Notes (Signed)
   PRENATAL VISIT NOTE  Subjective:  Jeanette Hall is a 22 y.o. G2P1001 at [redacted]w[redacted]d being seen today for ongoing prenatal care.  She is currently monitored for the following issues for this high-risk pregnancy and has Supervision of other normal pregnancy, antepartum; Insulin controlled gestational diabetes mellitus (GDM) during pregnancy; and Anemia during pregnancy on their problem list.  Patient reports occasional contractions.  Contractions: Not present. Vag. Bleeding: None.  Movement: Present. Denies leaking of fluid.   The following portions of the patient's history were reviewed and updated as appropriate: allergies, current medications, past family history, past medical history, past social history, past surgical history and problem list. Problem list updated.  Objective:   Vitals:   11/23/22 1049  BP: 129/75  Pulse: 82  Weight: 191 lb 9.6 oz (86.9 kg)    Fetal Status: Fetal Heart Rate (bpm): 131 Fundal Height: 38 cm Movement: Present     General:  Alert, oriented and cooperative. Patient is in no acute distress.  Skin: Skin is warm and dry. No rash noted.   Cardiovascular: Normal heart rate noted  Respiratory: Normal respiratory effort, no problems with respiration noted  Abdomen: Soft, gravid, appropriate for gestational age.  Pain/Pressure: Absent     Pelvic: Cervical exam performed        Extremities: Normal range of motion.  Edema: Trace  Mental Status:  Normal mood and affect. Normal behavior. Normal judgment and thought content.  Procedure: Patient informed of Risks, benefits, and alternatives of procedure.   Procedure done to begin ripening of the cervix prior to admission for induction of labor. Appropriate time out taken. The patient was placed in the lithotomy position and a cervical exam was performed and a finger was used to guide the 52F foley through the internal os of the cervix. Foley Balloon filled with 60cc of sterile water. Plug inserted into end of the  foley. Foley placed on tension and taped to medial thigh. All equipment was removed and accounted for. The patient tolerated the procedure well.   NST:  EFM Baseline: 135 bpm. Positive accelerations, no decelerations.  Toco: irregular, every 20 minutes There were no signs of tachysystole or hypertonus.  Assessment and Plan:  Pregnancy: G2P1001 at [redacted]w[redacted]d 1. Supervision of other normal pregnancy, antepartum --Anticipatory guidance about next visits/weeks of pregnancy given.   2. Insulin controlled gestational diabetes mellitus (GDM) in third trimester --IOL scheduled tomorrow, 11/24/22 --Foley balloon placed in office today, see below  3. [redacted] weeks gestation of pregnancy    S/p Outpatient placement of foley balloon catheter for cervical ripening. Induction of labor scheduled for tomorrow 12/04/22.  Reassuring FHR tracing with no concerns at present. Warning signs given to patient to include return to MAU for heavy vaginal bleeding, Rupture of membranes, painful uterine contractions q 5 mins or less, severe abdominal discomfort, decreased fetal movement.  Return for As scheduled for induction of labor on 11/23/22.   Sharen Counter, CNM 11/23/2022 11:48 AM

## 2022-11-23 NOTE — Patient Instructions (Signed)
Labor Precautions Reasons to come to MAU at [facility]:  1.  Contractions are  5 minutes apart or less, each last 1 minute, these have been going on for 1-2 hours, and you cannot walk or talk during them 2.  You have a large gush of fluid, or a trickle of fluid that will not stop and you have to wear a pad 3.  You have bleeding that is bright red, heavier than spotting--like menstrual bleeding (spotting can be normal in early labor or after a check of your cervix) 4.  You do not feel the baby moving like he/she normally does  

## 2022-11-24 ENCOUNTER — Inpatient Hospital Stay (HOSPITAL_COMMUNITY): Payer: Medicaid Other | Admitting: Anesthesiology

## 2022-11-24 ENCOUNTER — Other Ambulatory Visit: Payer: Self-pay

## 2022-11-24 ENCOUNTER — Inpatient Hospital Stay (HOSPITAL_COMMUNITY): Payer: Medicaid Other

## 2022-11-24 ENCOUNTER — Inpatient Hospital Stay (HOSPITAL_COMMUNITY)
Admission: AD | Admit: 2022-11-24 | Discharge: 2022-11-25 | DRG: 807 | Disposition: A | Discharge status: Home / Self Care | Payer: Medicaid Other | Attending: Family Medicine | Admitting: Family Medicine

## 2022-11-24 ENCOUNTER — Encounter (HOSPITAL_COMMUNITY): Payer: Self-pay | Admitting: Obstetrics and Gynecology

## 2022-11-24 DIAGNOSIS — O9902 Anemia complicating childbirth: Secondary | ICD-10-CM | POA: Diagnosis present

## 2022-11-24 DIAGNOSIS — Z8632 Personal history of gestational diabetes: Secondary | ICD-10-CM

## 2022-11-24 DIAGNOSIS — O24424 Gestational diabetes mellitus in childbirth, insulin controlled: Principal | ICD-10-CM | POA: Diagnosis present

## 2022-11-24 DIAGNOSIS — O24419 Gestational diabetes mellitus in pregnancy, unspecified control: Secondary | ICD-10-CM | POA: Diagnosis present

## 2022-11-24 DIAGNOSIS — Z3480 Encounter for supervision of other normal pregnancy, unspecified trimester: Secondary | ICD-10-CM

## 2022-11-24 DIAGNOSIS — Z3A37 37 weeks gestation of pregnancy: Secondary | ICD-10-CM | POA: Diagnosis not present

## 2022-11-24 DIAGNOSIS — O99019 Anemia complicating pregnancy, unspecified trimester: Secondary | ICD-10-CM | POA: Diagnosis present

## 2022-11-24 DIAGNOSIS — O24414 Gestational diabetes mellitus in pregnancy, insulin controlled: Secondary | ICD-10-CM

## 2022-11-24 LAB — COMPREHENSIVE METABOLIC PANEL
ALT: 15 U/L (ref 0–44)
AST: 19 U/L (ref 15–41)
Albumin: 2.5 g/dL — ABNORMAL LOW (ref 3.5–5.0)
Alkaline Phosphatase: 119 U/L (ref 38–126)
Anion gap: 12 (ref 5–15)
BUN: 10 mg/dL (ref 6–20)
CO2: 19 mmol/L — ABNORMAL LOW (ref 22–32)
Calcium: 8.7 mg/dL — ABNORMAL LOW (ref 8.9–10.3)
Chloride: 105 mmol/L (ref 98–111)
Creatinine, Ser: 0.73 mg/dL (ref 0.44–1.00)
GFR, Estimated: >60 mL/min (ref >60)
Glucose, Bld: 103 mg/dL — ABNORMAL HIGH (ref 70–99)
Potassium: 3.8 mmol/L (ref 3.5–5.1)
Sodium: 136 mmol/L (ref 135–145)
Total Bilirubin: 0.4 mg/dL (ref 0.3–1.2)
Total Protein: 6.2 g/dL — ABNORMAL LOW (ref 6.5–8.1)

## 2022-11-24 LAB — CBC
HCT: 33 % — ABNORMAL LOW (ref 36.0–46.0)
Hemoglobin: 10.2 g/dL — ABNORMAL LOW (ref 12.0–15.0)
MCH: 26.2 pg (ref 26.0–34.0)
MCHC: 30.9 g/dL (ref 30.0–36.0)
MCV: 84.6 fL (ref 80.0–100.0)
Platelets: 271 10*3/uL (ref 150–400)
RBC: 3.9 MIL/uL (ref 3.87–5.11)
RDW: 14.2 % (ref 11.5–15.5)
WBC: 8.6 10*3/uL (ref 4.0–10.5)
nRBC: 0 % (ref 0.0–0.2)

## 2022-11-24 LAB — GLUCOSE, CAPILLARY
Glucose-Capillary: 111 mg/dL — ABNORMAL HIGH (ref 70–99)
Glucose-Capillary: 91 mg/dL (ref 70–99)

## 2022-11-24 LAB — PROTEIN / CREATININE RATIO, URINE
Creatinine, Urine: 20 mg/dL
Total Protein, Urine: <6 mg/dL

## 2022-11-24 LAB — TYPE AND SCREEN
ABO/RH(D): B POS
Antibody Screen: NEGATIVE

## 2022-11-24 LAB — RPR: RPR Ser Ql: NONREACTIVE

## 2022-11-24 MED ORDER — PHENYLEPHRINE 80 MCG/ML (10ML) SYRINGE FOR IV PUSH (FOR BLOOD PRESSURE SUPPORT): 80.0000 ug | PREFILLED_SYRINGE | INTRAVENOUS | Status: DC | PRN

## 2022-11-24 MED ORDER — ZOLPIDEM TARTRATE 5 MG PO TABS: 5.0000 mg | ORAL_TABLET | Freq: Every evening | ORAL | Status: DC | PRN

## 2022-11-24 MED ORDER — OXYTOCIN BOLUS FROM INFUSION
333.0000 mL | Freq: Once | INTRAVENOUS | Status: AC
  Administered 2022-11-24: 333 mL via INTRAVENOUS

## 2022-11-24 MED ORDER — LIDOCAINE HCL (PF) 1 % IJ SOLN: 30.0000 mL | INTRAMUSCULAR | Status: DC | PRN

## 2022-11-24 MED ORDER — SODIUM CHLORIDE 0.9% FLUSH: 3.0000 mL | Freq: Two times a day (BID) | INTRAVENOUS | Status: DC

## 2022-11-24 MED ORDER — IBUPROFEN 600 MG PO TABS
600.0000 mg | ORAL_TABLET | Freq: Four times a day (QID) | ORAL | Status: DC
  Administered 2022-11-24 – 2022-11-25 (×5): 600 mg via ORAL
  Filled 2022-11-24 (×5): qty 1

## 2022-11-24 MED ORDER — DIPHENHYDRAMINE HCL 25 MG PO CAPS: 25.0000 mg | ORAL_CAPSULE | Freq: Four times a day (QID) | ORAL | Status: DC | PRN

## 2022-11-24 MED ORDER — SIMETHICONE 80 MG PO CHEW: 80.0000 mg | CHEWABLE_TABLET | ORAL | Status: DC | PRN

## 2022-11-24 MED ORDER — SODIUM CHLORIDE 0.9 % IV SOLN: 250.0000 mL | INTRAVENOUS | Status: DC | PRN

## 2022-11-24 MED ORDER — ACETAMINOPHEN 325 MG PO TABS
650.0000 mg | ORAL_TABLET | ORAL | Status: DC | PRN
  Administered 2022-11-24: 650 mg via ORAL
  Filled 2022-11-24: qty 2

## 2022-11-24 MED ORDER — OXYCODONE-ACETAMINOPHEN 5-325 MG PO TABS: 1.0000 | ORAL_TABLET | ORAL | Status: DC | PRN

## 2022-11-24 MED ORDER — FENTANYL-BUPIVACAINE-NACL 0.5-0.125-0.9 MG/250ML-% EP SOLN
12.0000 mL/h | EPIDURAL | Status: DC | PRN
  Filled 2022-11-24: qty 250

## 2022-11-24 MED ORDER — SODIUM CHLORIDE 0.9% FLUSH: 3.0000 mL | INTRAVENOUS | Status: DC | PRN

## 2022-11-24 MED ORDER — LIDOCAINE HCL (PF) 1 % IJ SOLN
INTRAMUSCULAR | Status: DC | PRN
  Administered 2022-11-24: 5 mL via EPIDURAL

## 2022-11-24 MED ORDER — OXYTOCIN-SODIUM CHLORIDE 30-0.9 UT/500ML-% IV SOLN
2.5000 [IU]/h | INTRAVENOUS | Status: DC
  Administered 2022-11-24: 2.5 [IU]/h via INTRAVENOUS
  Filled 2022-11-24: qty 500

## 2022-11-24 MED ORDER — OXYTOCIN-SODIUM CHLORIDE 30-0.9 UT/500ML-% IV SOLN
1.0000 m[IU]/min | INTRAVENOUS | Status: DC
  Administered 2022-11-24: 2 m[IU]/min via INTRAVENOUS

## 2022-11-24 MED ORDER — LACTATED RINGERS IV SOLN: 500.0000 mL | Freq: Once | INTRAVENOUS | Status: DC

## 2022-11-24 MED ORDER — ONDANSETRON HCL 4 MG/2ML IJ SOLN: 4.0000 mg | Freq: Four times a day (QID) | INTRAMUSCULAR | Status: DC | PRN

## 2022-11-24 MED ORDER — DIPHENHYDRAMINE HCL 50 MG/ML IJ SOLN: 12.5000 mg | INTRAMUSCULAR | Status: DC | PRN

## 2022-11-24 MED ORDER — PHENYLEPHRINE 80 MCG/ML (10ML) SYRINGE FOR IV PUSH (FOR BLOOD PRESSURE SUPPORT)
80.0000 ug | PREFILLED_SYRINGE | INTRAVENOUS | Status: DC | PRN
  Filled 2022-11-24: qty 10

## 2022-11-24 MED ORDER — SOD CITRATE-CITRIC ACID 500-334 MG/5ML PO SOLN: 30.0000 mL | ORAL | Status: DC | PRN

## 2022-11-24 MED ORDER — FENTANYL CITRATE (PF) 100 MCG/2ML IJ SOLN
50.0000 ug | INTRAMUSCULAR | Status: DC | PRN
  Administered 2022-11-24: 100 ug via INTRAVENOUS
  Filled 2022-11-24: qty 2

## 2022-11-24 MED ORDER — TERBUTALINE SULFATE 1 MG/ML IJ SOLN: 0.2500 mg | Freq: Once | INTRAMUSCULAR | Status: DC | PRN

## 2022-11-24 MED ORDER — DIBUCAINE (PERIANAL) 1 % EX OINT: 1.0000 | TOPICAL_OINTMENT | CUTANEOUS | Status: DC | PRN

## 2022-11-24 MED ORDER — SENNOSIDES-DOCUSATE SODIUM 8.6-50 MG PO TABS
2.0000 | ORAL_TABLET | ORAL | Status: DC
  Administered 2022-11-25: 2 via ORAL
  Filled 2022-11-24: qty 2

## 2022-11-24 MED ORDER — LACTATED RINGERS IV SOLN: INTRAVENOUS | Status: DC

## 2022-11-24 MED ORDER — ACETAMINOPHEN 325 MG PO TABS: 650.0000 mg | ORAL_TABLET | ORAL | Status: DC | PRN

## 2022-11-24 MED ORDER — WITCH HAZEL-GLYCERIN EX PADS: 1.0000 | MEDICATED_PAD | CUTANEOUS | Status: DC | PRN

## 2022-11-24 MED ORDER — FENTANYL-BUPIVACAINE-NACL 0.5-0.125-0.9 MG/250ML-% EP SOLN
EPIDURAL | Status: DC | PRN
  Administered 2022-11-24: 12 mL/h via EPIDURAL

## 2022-11-24 MED ORDER — ONDANSETRON HCL 4 MG/2ML IJ SOLN: 4.0000 mg | INTRAMUSCULAR | Status: DC | PRN

## 2022-11-24 MED ORDER — PRENATAL MULTIVITAMIN CH
1.0000 | ORAL_TABLET | Freq: Every day | ORAL | Status: DC
  Administered 2022-11-25: 1 via ORAL
  Filled 2022-11-24: qty 1

## 2022-11-24 MED ORDER — LACTATED RINGERS IV SOLN: 500.0000 mL | INTRAVENOUS | Status: DC | PRN

## 2022-11-24 MED ORDER — COCONUT OIL OIL: 1.0000 | TOPICAL_OIL | Status: DC | PRN

## 2022-11-24 MED ORDER — EPHEDRINE 5 MG/ML INJ: 10.0000 mg | INTRAVENOUS | Status: DC | PRN

## 2022-11-24 MED ORDER — ONDANSETRON HCL 4 MG PO TABS: 4.0000 mg | ORAL_TABLET | ORAL | Status: DC | PRN

## 2022-11-24 MED ORDER — OXYCODONE-ACETAMINOPHEN 5-325 MG PO TABS: 2.0000 | ORAL_TABLET | ORAL | Status: DC | PRN

## 2022-11-24 MED ORDER — BENZOCAINE-MENTHOL 20-0.5 % EX AERO
1.0000 | INHALATION_SPRAY | CUTANEOUS | Status: DC | PRN
  Administered 2022-11-24: 1 via TOPICAL
  Filled 2022-11-24: qty 56

## 2022-11-24 NOTE — H&P (Signed)
OBSTETRIC ADMISSION HISTORY AND PHYSICAL  Jeanette Hall is a 22 y.o. female G2P1001 with IUP at [redacted]w[redacted]d by LMP presenting for IOL for GDMA2. She reports +FMs, No LOF, no VB, no blurry vision, headaches or peripheral edema, and RUQ pain.  She plans on breat feeding. She request pp IUD for birth control. 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  2130 on 01/26/18   Major depressive disorder, recurrent episode, moderate (HCC) 04/06/2022   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   Vitamin D deficiency 08/27/2017    Past Surgical History: Past Surgical History:  Procedure Laterality Date   MASS EXCISION N/A 05/05/2016   Procedure: EXCISION  OF TONGUE MASS;  Surgeon: Newman Pies, MD;  Location: [facility];  Service: ENT;  Laterality: N/A;  EXCISION  OF TONGUE MASS   WISDOM TOOTH EXTRACTION      Obstetrical History: OB History     Gravida  2   Para  1   Term  1   Preterm  0   AB  0   Living  1      SAB  0   IAB  0   Ectopic  0   Multiple  0   Live Births  1           Social History Social History   Socioeconomic History    Marital status: Single    Spouse name: Not on file   Number of children: 1   Years of education: Not on file   Highest education level: Not on file  Occupational History   Not on file  Tobacco Use   Smoking status: Never   Smokeless tobacco: Never   Tobacco comments:    stepfather smokes outside  Vaping Use   Vaping Use: Never used  Substance and Sexual Activity   Alcohol use: No   Drug use: Not Currently    Types: Marijuana    Comment: last used 07/01/2022   Sexual activity: Yes    Partners: Male    Birth control/protection: None  Other Topics Concern   Not on file  Social History Narrative   Not on file   Social Determinants of Health   Financial Resource Strain: Not on file  Food Insecurity: No Food Insecurity (11/24/2022)   Hunger Vital Sign    Worried About Running Out of Food in the Last Year: Never true    Ran Out of Food in the Last Year: Never true  Transportation Needs: No Transportation Needs (11/24/2022)   PRAPARE - Transportation    Lack of Transportation (Medical): No    Lack  of Transportation (Non-Medical): No  Physical Activity: Not on file  Stress: Not on file  Social Connections: Not on file    Family History: Family History  Problem Relation Age of Onset   Hypertension Mother    Schizophrenia Brother        schizophrenia spectrum per report   Asthma Maternal Uncle    Hypertension Maternal Grandmother    Asthma Maternal Grandmother    Cancer Maternal Grandmother    Diabetes Maternal Grandmother    Cancer Paternal Grandmother     Allergies: Allergies  Allergen Reactions   Fish Allergy Anaphylaxis   Shellfish Allergy Anaphylaxis    Medications Prior to Admission  Medication Sig Dispense Refill Last Dose   albuterol (VENTOLIN HFA) 108 (90 Base) MCG/ACT inhaler INHALE 2 PUFFS INTO THE LUNGS EVERY 4 HOURS AS NEEDED FOR WHEEZING OR SHORTNESS OF BREATH 18 g 2 Past Week   ascorbic acid (VITAMIN C) 500 MG tablet Take 1 tablet (500 mg total) by  mouth every other day. Take with iron pill 30 tablet 3 Past Week   ferrous sulfate 325 (65 FE) MG tablet Take 1 tablet (325 mg total) by mouth every other day. 60 tablet 5 Past Week   insulin detemir (LEVEMIR) 100 unit/ml SOLN Inject 6 Units into the skin at bedtime. 15 mL 0 11/22/2022   loratadine (CLARITIN) 10 MG tablet Take 1 tablet (10 mg total) by mouth daily. 30 tablet 11 11/23/2022   Prenat-FeCbn-FeAsp-Meth-FA-DHA (PRENATE MINI) 18-0.6-0.4-350 MG CAPS Take 1 capsule by mouth daily before breakfast. 90 capsule 4 Past Week   sertraline (ZOLOFT) 25 MG tablet Take 1 tablet (25 mg total) by mouth daily. 30 tablet 2 Past Week   Accu-Chek Softclix Lancets lancets Take Blood Sugar reading 4 times daily. When you wake up before breakfast, after breakfast, after lunch and after dinner. 100 each 12    Blood Glucose Monitoring Suppl (ACCU-CHEK GUIDE) w/Device KIT 1 each by Does not apply route 4 (four) times daily. Test your blood sugar when you wake up before breakfast, after breakfast, after lunch & after dinner. (Patient not taking: Reported on 10/19/2022) 1 kit 0    Blood Pressure Monitoring (BLOOD PRESSURE KIT) DEVI 1 Device by Does not apply route once a week. 1 each 0    folic acid (FOLVITE) 1 MG tablet Take 1 mg by mouth daily.   Unknown   hydrocerin (EUCERIN) CREA Apply 1 Application topically 3 (three) times daily. 454 g 0    hydrocortisone 2.5 % ointment Apply topically 2 (two) times daily as needed. (Patient not taking: Reported on 11/18/2022) 28.35 g 2    Insulin Pen Needle 32G X 4 MM MISC Use one pen needle to injection insulin nightly 100 each 1    metroNIDAZOLE (FLAGYL) 500 MG tablet Take 1 tablet (500 mg total) by mouth 2 (two) times daily. 14 tablet 0      Review of Systems   All systems reviewed and negative except as stated in HPI  Blood pressure 127/83, pulse (!) 116, temperature 98.2 F (36.8 C), temperature source Oral, resp. rate 16, height 5\' 4"  (1.626 m), weight 88.3 kg, last  menstrual period 02/08/2022. General appearance: alert, cooperative, and appears stated age Lungs: normal work of breathing Heart: regular rate  Abdomen: soft, non-tender Extremities: Homans sign is negative, no sign of DVT  Presentation: cephalic Fetal monitoringBaseline: 145 bpm, Variability: Good {> 6 bpm), Accelerations: Reactive, and Decelerations: Absent Uterine activity Occasional contraction Dilation: 4 Effacement (%): 60  Station: -3 Exam by:: Raliegh Ip RN   Prenatal labs: ABO, Rh: --/--/B POS (05/14 4010) Antibody: NEG (05/14 0714) Rubella: 1.76 (11/22 1058) RPR: Non Reactive (03/07 0838)  HBsAg: Negative (11/22 1058)  HIV: Non Reactive (03/07 2725)  GBS: Negative/-- (05/06 1050)  1 hr Glucola elevated  Genetic screening  Normal Anatomy US Normal  Prenatal Transfer Tool  Maternal Diabetes: Yes:  Diabetes Type:  Insulin/Medication controlled Genetic Screening: Normal Maternal Ultrasounds/Referrals: Normal Fetal Ultrasounds or other Referrals:  None Maternal Substance Abuse:  No Significant Maternal Medications:  None Significant Maternal Lab Results:  Group B Strep negative Number of Prenatal Visits:greater than 3 verified prenatal visits Other Comments:  None  Results for orders placed or performed during the hospital encounter of 11/24/22 (from the past 24 hour(s))  CBC   Collection Time: 11/24/22  7:14 AM  Result Value Ref Range   WBC 8.6 4.0 - 10.5 K/uL   RBC 3.90 3.87 - 5.11 MIL/uL   Hemoglobin 10.2 (L) 12.0 - 15.0 g/dL   HCT 36.6 (L) 44.0 - 34.7 %   MCV 84.6 80.0 - 100.0 fL   MCH 26.2 26.0 - 34.0 pg   MCHC 30.9 30.0 - 36.0 g/dL   RDW 42.5 95.6 - 38.7 %   Platelets 271 150 - 400 K/uL   nRBC 0.0 0.0 - 0.2 %  Comprehensive metabolic panel   Collection Time: 11/24/22  7:14 AM  Result Value Ref Range   Sodium 136 135 - 145 mmol/L   Potassium 3.8 3.5 - 5.1 mmol/L   Chloride 105 98 - 111 mmol/L   CO2 19 (L) 22 - 32 mmol/L   Glucose, Bld 103 (H)  70 - 99 mg/dL   BUN 10 6 - 20 mg/dL   Creatinine, Ser 5.64 0.44 - 1.00 mg/dL   Calcium 8.7 (L) 8.9 - 10.3 mg/dL   Total Protein 6.2 (L) 6.5 - 8.1 g/dL   Albumin 2.5 (L) 3.5 - 5.0 g/dL   AST 19 15 - 41 U/L   ALT 15 0 - 44 U/L   Alkaline Phosphatase 119 38 - 126 U/L   Total Bilirubin 0.4 0.3 - 1.2 mg/dL   GFR, Estimated >33 >29 mL/min   Anion gap 12 5 - 15  Type and screen   Collection Time: 11/24/22  7:14 AM  Result Value Ref Range   ABO/RH(D) B POS    Antibody Screen NEG    Sample Expiration      11/27/2022,2359 Performed at [facility], [address], [place], [place] [address]     Patient Active Problem List   Diagnosis Date Noted   Gestational diabetes mellitus 11/24/2022   Insulin controlled gestational diabetes mellitus (GDM) during pregnancy 09/18/2022   Anemia during pregnancy 09/18/2022   Supervision of other normal pregnancy, antepartum 04/29/2022    Assessment/Plan:  Jeanette Hall is a 22 y.o. G2P1001 at [redacted]w[redacted]d here forIOL for GDMA2  #Labor:s/p OP FB. Will start pitocin and consider AROM at next check.  #Pain: Per patient request  #FWB: Cat 1 #ID:  GBS negative  #MOF: breast  #MOC:ppIUD #GDMA2: SSI with Q4 cbgs in latent labor and Q2H in active labor   Celedonio Savage, MD  11/24/2022, 8:55 AM

## 2022-11-24 NOTE — Progress Notes (Addendum)
Labor Progress Note  Jeanette Hall is a 22 y.o. G2P1001 at [redacted]w[redacted]d presented for IOL A1GDM  S: doing well, no concerns  O:  BP 128/63   Pulse 85   Temp 98.2 F (36.8 C) (Oral)   Resp 18   Ht 5\' 4"  (1.626 m)   Wt 88.3 kg   LMP 02/08/2022   BMI 33.42 kg/m  EFM: 135bpm/Moderate variability/ 15x15 accels/ None decels  CVE: Dilation: 5 Effacement (%): 70 Cervical Position: Posterior Station: -2 Presentation: Vertex Exam by:: Dr. Polly Cobia   A&P: 22 y.o. [id number] [redacted]w[redacted]d  here for IOL for A1GDM as above  #Labor: Progressing well. S/p PO cytotec and FB. AROM clear fluid @1326 . Anticipate SVD #Pain: Epidural planned #FWB: CAT 1 #GBS negative #A1GDM continue to monitor  Vonna Drafts, MD FM PGY1, Faculty practice [facility], Center for [facility] 11/24/22  1:32 PM

## 2022-11-24 NOTE — Anesthesia Preprocedure Evaluation (Addendum)
Anesthesia Evaluation  Patient identified by MRN, date of birth, ID band Patient awake    Reviewed: Allergy & Precautions, NPO status , Patient's Chart, lab work & pertinent test results  Airway Mallampati: II  TM Distance: >3 FB Neck ROM: Full    Dental no notable dental hx. (+) Teeth Intact, Dental Advisory Given   Pulmonary asthma    Pulmonary exam normal breath sounds clear to auscultation       Cardiovascular Exercise Tolerance: Good negative cardio ROS Normal cardiovascular exam Rhythm:Regular Rate:Normal     Neuro/Psych  PSYCHIATRIC DISORDERS Anxiety Depression    negative neurological ROS     GI/Hepatic negative GI ROS, Neg liver ROS,,,  Endo/Other  diabetes, Gestational    Renal/GU negative Renal ROS     Musculoskeletal   Abdominal   Peds  Hematology  (+) Blood dyscrasia, anemia Lab Results      Component                Value               Date                      WBC                      8.6                 11/24/2022                HGB                      10.2 (L)            11/24/2022                HCT                      33.0 (L)            11/24/2022                MCV                      84.6                11/24/2022                PLT                      271                 11/24/2022              Anesthesia Other Findings   Reproductive/Obstetrics (+) Pregnancy                             Anesthesia Physical Anesthesia Plan  ASA: 3  Anesthesia Plan: Epidural   Post-op Pain Management:    Induction:   PONV Risk Score and Plan:   Airway Management Planned:   Additional Equipment:   Intra-op Plan:   Post-operative Plan:   Informed Consent: I have reviewed the patients History and Physical, chart, labs and discussed the procedure including the risks, benefits and alternatives for the proposed anesthesia with the patient or authorized representative  who has indicated his/her understanding and acceptance.  Plan Discussed with:   Anesthesia Plan Comments: (37.2 wk g2p1 w hx of gDm and Asthma for LEA)       Anesthesia Quick Evaluation

## 2022-11-24 NOTE — Discharge Instructions (Signed)
NO SEX UNTIL AFTER YOU GET YOUR BIRTH CONTROL  

## 2022-11-24 NOTE — Discharge Summary (Signed)
Postpartum Discharge Summary  Date of Service updated***     Patient Name: Jeanette Hall DOB: 02-Nov-2000 MRN: [id number]  Date of admission: 11/24/2022 Delivery date:11/24/2022  Delivering provider: Celedonio Savage  Date of discharge: 11/24/2022  Admitting diagnosis: Gestational diabetes mellitus [O24.419] Intrauterine pregnancy: [redacted]w[redacted]d     Secondary diagnosis:  Principal Problem:   Gestational diabetes mellitus Active Problems:   Supervision of other normal pregnancy, antepartum   Insulin controlled gestational diabetes mellitus (GDM) during pregnancy   Anemia during pregnancy   Vaginal delivery  Additional problems: ***    Discharge diagnosis: Term Pregnancy Delivered and GDM A2                                              Post partum procedures:{Postpartum procedures:23558} Augmentation: AROM, Pitocin, and OP Foley Complications: None  Hospital course: Induction of Labor With Vaginal Delivery   22 y.o. [name] at [redacted]w[redacted]d was admitted to the hospital 11/24/2022 for induction of labor.  Indication for induction: A2 DM.  Patient had an uncomplicated labor course.  Membrane Rupture Time/Date: 1:26 PM ,11/24/2022   Delivery Method:Vaginal, Spontaneous  Episiotomy: None  Lacerations:    Details of delivery can be found in separate delivery note.  Patient had a postpartum course complicated by***. Patient is discharged home 11/24/22.  Newborn Data: Birth date:11/24/2022  Birth time:4:53 PM  Gender:Female  Living status:  Apgars: ,  Weight:   Magnesium Sulfate received: No BMZ received: No Rhophylac:N/A MMR:N/A T-DaP:Given prenatally Flu: N/A Transfusion:{Transfusion received:30440034}  Physical exam  Vitals:   11/24/22 1503 11/24/22 1528 11/24/22 1600 11/24/22 1603  BP:    130/81  Pulse:    78  Resp: 16 (!) 22 16   Temp:   98.4 F (36.9 C)   TempSrc:   Oral   Weight:      Height:       General: {Exam; general:21111117} Lochia: {Desc;  appropriate/inappropriate:30686::"appropriate"} Uterine Fundus: {Desc; firm/soft:30687} Incision: {Exam; incision:21111123} DVT Evaluation: {Exam; dvt:2111122} Labs: Lab Results  Component Value Date   WBC 8.6 11/24/2022   HGB 10.2 (L) 11/24/2022   HCT 33.0 (L) 11/24/2022   MCV 84.6 11/24/2022   PLT 271 11/24/2022      Latest Ref Rng & Units 11/24/2022    7:14 AM  CMP  Glucose 70 - 99 mg/dL 409   BUN 6 - 20 mg/dL 10   Creatinine 8.11 - 1.00 mg/dL 9.14   Sodium 782 - 956 mmol/L 136   Potassium 3.5 - 5.1 mmol/L 3.8   Chloride 98 - 111 mmol/L 105   CO2 22 - 32 mmol/L 19   Calcium 8.9 - 10.3 mg/dL 8.7   Total Protein 6.5 - 8.1 g/dL 6.2   Total Bilirubin 0.3 - 1.2 mg/dL 0.4   Alkaline Phos 38 - 126 U/L 119   AST 15 - 41 U/L 19   ALT 0 - 44 U/L 15    Edinburgh Score:    02/24/2018    3:17 PM  Edinburgh Postnatal Depression Scale Screening Tool  I have been able to laugh and see the funny side of things. 0  I have looked forward with enjoyment to things. 0  I have blamed myself unnecessarily when things went wrong. 0  I have been anxious or worried for no good reason. 0  I have felt scared or  panicky for no good reason. 0  Things have been getting on top of me. 0  I have been so unhappy that I have had difficulty sleeping. 0  I have felt sad or miserable. 0  I have been so unhappy that I have been crying. 0  The thought of harming myself has occurred to me. 0  Edinburgh Postnatal Depression Scale Total 0     After visit meds:  Allergies as of 11/24/2022       Reactions   Fish Allergy Anaphylaxis   Shellfish Allergy Anaphylaxis     Med Rec must be completed prior to using this [facility]***        Discharge home in stable condition Infant Feeding: {Baby feeding:23562} Infant Disposition:{CHL IP OB HOME WITH ZOXWRU:04540} Discharge instruction: per After Visit Summary and Postpartum booklet. Activity: Advance as tolerated. Pelvic rest for 6 weeks.  Diet: {OB  JWJX:[id number]} Future Appointments: Future Appointments  Date Time Provider Department Center  11/30/2022 10:35 AM Constant, Gigi Gin, MD [facility] None  12/08/2022 10:35 AM Warden Fillers, MD [facility] None   Follow up Visit: Message sent   Please schedule this patient for a In person postpartum visit in 6 weeks with the following provider: Any provider. Additional Postpartum F/U:2 hour GTT  High risk pregnancy complicated by: GDM Delivery mode:  Vaginal, Spontaneous  Anticipated Birth Control:  IUD, needs placement at 6 week visit    11/24/2022 Celedonio Savage, MD

## 2022-11-24 NOTE — Anesthesia Procedure Notes (Signed)
Epidural Patient location during procedure: OB Start time: 11/24/2022 4:32 PM End time: 11/24/2022 4:51 PM  Staffing Anesthesiologist: Trevor Iha, MD Performed: anesthesiologist   Preanesthetic Checklist Completed: patient identified, IV checked, site marked, risks and benefits discussed, surgical consent, monitors and equipment checked, pre-op evaluation and timeout performed  Epidural Patient position: sitting Prep: DuraPrep and site prepped and draped Patient monitoring: continuous pulse ox and blood pressure Approach: midline Location: L2-L3 Injection technique: LOR air  Needle:  Needle type: Tuohy  Needle gauge: 17 G Needle length: 9 cm and 9 Needle insertion depth: 9 cm Catheter type: closed end flexible Catheter size: 19 Gauge Catheter at skin depth: 14 cm Test dose: negative  Assessment Events: blood not aspirated, no cerebrospinal fluid, injection not painful, no injection resistance, no paresthesia and negative IV test  Additional Notes Patient identified. Risks/Benefits/Options discussed with patient including but not limited to bleeding, infection, nerve damage, paralysis, failed block, incomplete pain control, headache, blood pressure changes, nausea, vomiting, reactions to medication both or allergic, itching and postpartum back pain. Confirmed with bedside nurse the patient's most recent platelet count. Confirmed with patient that they are not currently taking any anticoagulation, have any bleeding history or any family history of bleeding disorders. Patient expressed understanding and wished to proceed. All questions were answered. Sterile technique was used throughout the entire procedure. Please see nursing notes for vital signs. Test dose was given through epidural needle and negative prior to continuing to dose epidural or start infusion. Warning signs of high block given to the patient including shortness of breath, tingling/numbness in hands, complete  motor block, or any concerning symptoms with instructions to call for help. Patient was given instructions on fall risk and not to get out of bed. All questions and concerns addressed with instructions to call with any issues.  Pt would not stay still resulting 3Attempt (S) . Patient tolerated procedure well.

## 2022-11-25 ENCOUNTER — Other Ambulatory Visit: Payer: Self-pay

## 2022-11-25 ENCOUNTER — Other Ambulatory Visit (HOSPITAL_COMMUNITY): Payer: Self-pay

## 2022-11-25 LAB — CBC
HCT: 29.3 % — ABNORMAL LOW (ref 36.0–46.0)
Hemoglobin: 9.4 g/dL — ABNORMAL LOW (ref 12.0–15.0)
MCH: 26.5 pg (ref 26.0–34.0)
MCHC: 32.1 g/dL (ref 30.0–36.0)
MCV: 82.5 fL (ref 80.0–100.0)
Platelets: 234 10*3/uL (ref 150–400)
RBC: 3.55 MIL/uL — ABNORMAL LOW (ref 3.87–5.11)
RDW: 14.2 % (ref 11.5–15.5)
WBC: 12.9 10*3/uL — ABNORMAL HIGH (ref 4.0–10.5)
nRBC: 0 % (ref 0.0–0.2)

## 2022-11-25 LAB — GLUCOSE, CAPILLARY: Glucose-Capillary: 81 mg/dL (ref 70–99)

## 2022-11-25 MED ORDER — FERROUS SULFATE 325 (65 FE) MG PO TABS
325.0000 mg | ORAL_TABLET | ORAL | Status: DC
  Administered 2022-11-25: 325 mg via ORAL
  Filled 2022-11-25: qty 1

## 2022-11-25 MED ORDER — IBUPROFEN 600 MG PO TABS
600.0000 mg | ORAL_TABLET | Freq: Four times a day (QID) | ORAL | 0 refills | Status: DC | PRN
  Filled 2022-11-25: qty 30, 8d supply, fill #0

## 2022-11-25 NOTE — Clinical Social Work Maternal (Signed)
CLINICAL SOCIAL WORK MATERNAL/CHILD NOTE  Patient Details  Name: Jeanette Hall MRN: [id number] Date of Birth: 08/08/2000  Date:  11/25/2022  Clinical Social Worker Initiating Note:  Enos Fling Date/Time: Initiated:  11/25/22/1255     Child's Name:  Lytle Michaels   Biological Parents:  Mother, Father Amry Fingerhut and Benjaman Kindler)   Need for Interpreter:  None   Reason for Referral:  Behavioral Health Concerns   Address:  [address] [place] [address]    Phone number:  [phone] (home)     Additional phone number:   Household Members/Support Persons (HM/SP):   Household Member/Support Person 1, Household Member/Support Person 2   HM/SP Name Relationship DOB or Age  HM/SP -1 Benjaman Kindler FOB [birth year] 2003  HM/SP -2 Nickie Retort MOB's mom    HM/SP -3        HM/SP -4        HM/SP -5        HM/SP -6        HM/SP -7        HM/SP -8          Natural Supports (not living in the home):  Immediate Family, Extended Family   Professional Supports: None   Employment: Unemployed   Type of Work:     Education:  Engineer, agricultural   Homebound arranged:    Surveyor, quantity Resources:  Medicaid   Other Resources:  Sales executive  , WIC   Cultural/Religious Considerations Which May Impact Care:    Strengths:  Ability to meet basic needs  , Merchandiser, retail, Understanding of illness   Psychotropic Medications:  Zoloft      Pediatrician:    Armed forces operational officer area  Pediatrician List:   [facility] ([address] [employer])  [place]    [place]      Pediatrician Fax Number:    Risk Factors/Current Problems:  Mental Health Concerns     Cognitive State:  Able to Concentrate  , Alert  , Goal Oriented  , Insightful  , Linear Thinking     Mood/Affect:  Interested  , Comfortable  , Calm  , Relaxed     CSW Assessment: CSW received a consult for  anxiety,depression,PPD and suicide attempt. CSW met MOB at bedside to complete full psychosocial assessment. CSW entered room, introduced herself and acknowledged that guest was present. MOB gave CSW verbal consent to speak about anything while FOB was present. CSW explained her role and the reason for the visit. MOB presented as calm, was agreeable to consult and remained engaged throughout encounter.  CSW collected MOB's demographic information and inquired about her mental health history. MOB reported experiencing anxiety, depression and PPD in 2022. MOB reported experiencing  feelings that included sadness and isolation during PP period. MOB reported the symptoms were left untreated and was hospitalized for support. MOB reported being prescribed medication (Lexapro) and participating in therapy for support of symptoms once discharged from hospital. MOB reported switching medication for pregnancy to Zoloft and currently participating in therapy; last visit was 2 months ago. MOB reported a plan of action for the upcoming PP period, of remaining on her medication regiment and remaining consist with her therapist for support. MOB reported no concerns regarding SA in 2022. MOB reported feeling "good" and bonded well with infant. CSW provided education regarding the baby blues  period vs. perinatal mood disorders, discussed treatment and gave resources for mental health follow up if concerns arise.  CSW recommends self-evaluation during the postpartum time period using the New Mom Checklist from Postpartum Progress and encouraged MOB to contact a medical professional if symptoms are noted at any time. CSW assessed for safety with MOB SI and HI; MOB denied all. CSW did not assess for DV; FOB was present.  CSW assessed for resources needs with MOB. MOB reported receiving WIC and food stamps for support. MOB reported having all essential items for infant including a car seat and bassinet for safe sleeping. CSW  provided review of Sudden Infant Death Syndrome (SIDS) precautions.   CSW Plan/Description:  CSW identifies no further need for intervention and no barriers to discharge at this time.     Barnetta Chapel, LCSW 11/25/2022, 12:58 PM

## 2022-11-25 NOTE — Lactation Note (Signed)
This note was copied from a baby's chart. Lactation Consultation Note  Patient Name: Girl Johnnie Bartolotta [id number] Date: 11/25/2022 Age:22 hours  Mom declines Lactation services. Mom stated the baby is latching well and she doesn't need Lactation.   Maternal Data    Feeding    LATCH Score                    Lactation Tools Discussed/Used    Interventions    Discharge    Consult Status Consult Status: Complete    ,  G 11/25/2022, 6:05 AM

## 2022-11-25 NOTE — Anesthesia Postprocedure Evaluation (Signed)
Anesthesia Post Note  Patient: Jeanette Hall  Procedure(s) Performed: AN AD HOC LABOR EPIDURAL     Patient location during evaluation: Mother Baby Anesthesia Type: Epidural Level of consciousness: awake, oriented and awake and alert Pain management: pain level controlled Vital Signs Assessment: post-procedure vital signs reviewed and stable Respiratory status: respiratory function stable, spontaneous breathing and nonlabored ventilation Cardiovascular status: stable Postop Assessment: no headache, adequate PO intake, able to ambulate, patient able to bend at knees and no apparent nausea or vomiting Anesthetic complications: no   No notable events documented.  Last Vitals:  Vitals:   11/25/22 0446 11/25/22 0520  BP: (!) 103/54   Pulse: (!) 56   Resp: 18   Temp: 36.7 C   SpO2: 99% 99%    Last Pain:  Vitals:   11/25/22 0520  TempSrc:   PainSc: 4    Pain Goal:                   ,

## 2022-11-30 ENCOUNTER — Encounter: Payer: Medicaid Other | Admitting: Obstetrics and Gynecology

## 2022-12-02 ENCOUNTER — Other Ambulatory Visit: Payer: Medicaid Other

## 2022-12-03 ENCOUNTER — Telehealth (HOSPITAL_COMMUNITY): Payer: Self-pay | Admitting: *Deleted

## 2022-12-03 ENCOUNTER — Other Ambulatory Visit (HOSPITAL_COMMUNITY): Payer: Self-pay

## 2022-12-03 NOTE — Telephone Encounter (Signed)
Mom reports feeling good. No concerns about herself at this time. EPDS=4 [facility] score=5) Mom reports baby is doing well. Feeding, peeing, and pooping without difficulty. Safe sleep reviewed. Mom reports no concerns about baby at present.  Duffy Rhody, RN 12-03-2022 at 11:35am

## 2022-12-08 ENCOUNTER — Encounter: Payer: Medicaid Other | Admitting: Obstetrics and Gynecology

## 2023-01-06 ENCOUNTER — Other Ambulatory Visit: Payer: Medicaid Other

## 2023-01-06 ENCOUNTER — Encounter: Payer: Self-pay | Admitting: Obstetrics

## 2023-01-06 ENCOUNTER — Ambulatory Visit (INDEPENDENT_AMBULATORY_CARE_PROVIDER_SITE_OTHER): Payer: Medicaid Other | Admitting: Obstetrics and Gynecology

## 2023-01-06 DIAGNOSIS — Z975 Presence of (intrauterine) contraceptive device: Secondary | ICD-10-CM

## 2023-01-06 DIAGNOSIS — Z30430 Encounter for insertion of intrauterine contraceptive device: Secondary | ICD-10-CM

## 2023-01-06 DIAGNOSIS — Z3202 Encounter for pregnancy test, result negative: Secondary | ICD-10-CM

## 2023-01-06 DIAGNOSIS — O24419 Gestational diabetes mellitus in pregnancy, unspecified control: Secondary | ICD-10-CM

## 2023-01-06 LAB — POCT URINE PREGNANCY: Preg Test, Ur: NEGATIVE

## 2023-01-06 MED ORDER — LEVONORGESTREL 20 MCG/DAY IU IUD
1.0000 | INTRAUTERINE_SYSTEM | Freq: Once | INTRAUTERINE | Status: AC
  Administered 2023-01-06: 1 via INTRAUTERINE

## 2023-01-06 NOTE — Patient Instructions (Signed)

## 2023-01-06 NOTE — Progress Notes (Signed)
Administrations This Visit     levonorgestrel (MIRENA) 20 MCG/DAY IUD 1 each     Admin Date 01/06/2023 Action Given Dose 1 each Route Intrauterine Administered By Maretta Bees, RMA

## 2023-01-06 NOTE — Progress Notes (Addendum)
Post Partum Visit Note  Jeanette Hall is a 22 y.o. G37P2002 female who presents for a postpartum visit. She is 6 weeks postpartum following a normal spontaneous vaginal delivery.  I have fully reviewed the prenatal and intrapartum course. The delivery was at 37.2 gestational weeks.  Anesthesia: epidural. Postpartum course has been unremarkable. Baby is doing well. Baby is feeding by bottle - Similac Advance. Bleeding no bleeding. Bowel function is normal. Bladder function is normal. Patient is not sexually active. Contraception method is IUD. Postpartum depression screening: negative.   Upstream - 01/06/23 0947       Pregnancy Intention Screening   Does the patient want to become pregnant in the next year? No    Does the patient's partner want to become pregnant in the next year? No    Would the patient like to discuss contraceptive options today? Yes            The pregnancy intention screening data noted above was reviewed. Potential methods of contraception were discussed. The patient elected to proceed with No data recorded.   Edinburgh Postnatal Depression Scale - 01/06/23 0946       Edinburgh Postnatal Depression Scale:  In the Past 7 Days   I have been able to laugh and see the funny side of things. 0    I have looked forward with enjoyment to things. 0    I have blamed myself unnecessarily when things went wrong. 0    I have been anxious or worried for no good reason. 0    I have felt scared or panicky for no good reason. 0    Things have been getting on top of me. 0    I have been so unhappy that I have had difficulty sleeping. 0    I have felt sad or miserable. 0    I have been so unhappy that I have been crying. 0    The thought of harming myself has occurred to me. 0    Edinburgh Postnatal Depression Scale Total 0             Health Maintenance Due  Topic Date Due   COVID-19 Vaccine (1) Never done   HPV VACCINES (1 - 2-dose series) Never done    The  following portions of the patient's history were reviewed and updated as appropriate: allergies, current medications, past family history, past medical history, past social history, past surgical history, and problem list.  Review of Systems Pertinent items are noted in HPI.  Objective:  BP 106/65   Pulse 73   Ht 5\' 4"  (1.626 m)   Wt 185 lb (83.9 kg)   LMP 02/08/2022   BMI 31.76 kg/m    General:  alert and cooperative   Breasts:  not indicated  Lungs: Normal effort  Heart:  Normal rate  Abdomen: Soft, nontender    Wound N/a  GU exam:  normal, Mirena IUD placed        Assessment:   1. Postpartum examination following vaginal delivery Doing well overall  2. Gestational diabetes mellitus (GDM) affecting pregnancy, antepartum GTT today  - Glucose tolerance, 2 hours - POCT urine pregnancy  3. Encounter for IUD insertion Mirena IUD placed today    Plan:   Essential components of care per ACOG recommendations:  1.  Mood and well being: Patient with negative depression screening today. Reviewed local resources for support.  - Patient tobacco use? No.   - hx of drug  use? No.    2. Infant care and feeding:  -Patient currently breastmilk feeding? No.  -Social determinants of health (SDOH) reviewed in EPIC. No concerns  3. Sexuality, contraception and birth spacing - Patient does not want a pregnancy in the next year.  Desired family size is 2 children.  - Reviewed reproductive life planning. Reviewed contraceptive methods based on pt preferences and effectiveness.  Patient desired IUD or IUS today.   - Discussed birth spacing of 18 months  4. Sleep and fatigue -Encouraged family/partner/community support of 4 hrs of uninterrupted sleep to help with mood and fatigue  5. Physical Recovery  - Discussed patients delivery and complications. She describes her labor as good. - Patient had a Vaginal, no problems at delivery. Patient had a  hemostatic  laceration without repair  Perineal healing reviewed. Patient expressed understanding - Patient has urinary incontinence? No. - Patient is safe to resume physical and sexual activity  6.  Health Maintenance - HM due items addressed Yes - Last pap smear  Diagnosis  Date Value Ref Range Status  06/03/2022   Final   - Negative for intraepithelial lesion or malignancy (NILM)   Pap smear not done at today's visit.  -Breast Cancer screening indicated? No.   7. Chronic Disease/Pregnancy Condition follow up: Gestational Diabetes   GYNECOLOGY CLINIC PROCEDURE NOTE IUD Insertion Procedure Note Patient identified, informed consent performed, consent signed.   Discussed risks of irregular bleeding, cramping, infection, malpositioning or misplacement of the IUD outside the uterus which may require further procedure such as laparoscopy. Time out was performed.  Urine pregnancy test negative.  Speculum placed in the vagina.  Cervix visualized.  Cleaned with Betadine x 2.  Grasped anteriorly with a single tooth tenaculum.  Uterus sounded to 9 cm.  Mirena IUD placed per manufacturer's recommendations.  Strings trimmed to 3 cm. Tenaculum was removed, good hemostasis noted.  Patient tolerated procedure well.   Patient was given post-procedure instructions.  She was advised to have backup contraception for one week.  Patient was also asked to check IUD strings periodically and follow up in 4 weeks for IUD check.  Future Appointments  Date Time Provider Department Center  02/03/2023 11:15 AM Sue Lush, FNP [facility] None    Albertine Grates, FNP [facility] [employer], [facility]

## 2023-01-07 LAB — GLUCOSE TOLERANCE, 2 HOURS
Glucose, 2 hour: 134 mg/dL (ref 70–139)
Glucose, GTT - Fasting: 98 mg/dL (ref 70–99)

## 2023-01-15 ENCOUNTER — Other Ambulatory Visit (HOSPITAL_COMMUNITY): Payer: Self-pay

## 2023-02-03 ENCOUNTER — Ambulatory Visit: Payer: MEDICAID | Admitting: Obstetrics and Gynecology

## 2023-03-10 ENCOUNTER — Other Ambulatory Visit (HOSPITAL_COMMUNITY): Payer: Self-pay

## 2023-03-10 MED ORDER — TACROLIMUS 0.1 % EX OINT
TOPICAL_OINTMENT | CUTANEOUS | 0 refills | Status: DC
  Filled 2023-03-10: qty 60, 30d supply, fill #0
  Filled 2023-03-29 (×2): qty 30, 10d supply, fill #0

## 2023-03-12 ENCOUNTER — Other Ambulatory Visit (HOSPITAL_COMMUNITY): Payer: Self-pay

## 2023-03-29 ENCOUNTER — Other Ambulatory Visit (HOSPITAL_COMMUNITY): Payer: Self-pay

## 2023-04-01 ENCOUNTER — Other Ambulatory Visit (HOSPITAL_COMMUNITY): Payer: Self-pay

## 2023-04-19 ENCOUNTER — Other Ambulatory Visit (HOSPITAL_COMMUNITY): Payer: Self-pay

## 2023-04-20 ENCOUNTER — Other Ambulatory Visit (HOSPITAL_COMMUNITY): Payer: Self-pay

## 2023-04-30 ENCOUNTER — Other Ambulatory Visit: Payer: Self-pay

## 2023-04-30 MED ORDER — HYDROXYZINE PAMOATE 25 MG PO CAPS
25.0000 mg | ORAL_CAPSULE | Freq: Every day | ORAL | 1 refills | Status: AC
  Filled 2023-04-30: qty 30, 30d supply, fill #0

## 2023-04-30 MED ORDER — TACROLIMUS 0.1 % EX OINT
1.0000 | TOPICAL_OINTMENT | Freq: Two times a day (BID) | CUTANEOUS | 2 refills | Status: AC
  Filled 2023-04-30: qty 60, 30d supply, fill #0
  Filled 2023-05-18 – 2023-05-25 (×2): qty 60, 30d supply, fill #1
  Filled 2023-06-14: qty 60, 30d supply, fill #2

## 2023-05-04 ENCOUNTER — Other Ambulatory Visit: Payer: Self-pay

## 2023-05-04 ENCOUNTER — Other Ambulatory Visit (HOSPITAL_COMMUNITY): Payer: Self-pay

## 2023-05-18 ENCOUNTER — Other Ambulatory Visit: Payer: Self-pay

## 2023-05-25 ENCOUNTER — Other Ambulatory Visit (HOSPITAL_COMMUNITY): Payer: Self-pay

## 2023-06-14 ENCOUNTER — Other Ambulatory Visit: Payer: Self-pay

## 2023-07-09 ENCOUNTER — Other Ambulatory Visit: Payer: Self-pay

## 2023-07-09 MED ORDER — TACROLIMUS 0.1 % EX OINT
1.0000 | TOPICAL_OINTMENT | Freq: Two times a day (BID) | CUTANEOUS | 1 refills | Status: AC
  Filled 2023-07-09: qty 60, 30d supply, fill #0

## 2023-07-09 MED ORDER — HYDROXYZINE PAMOATE 25 MG PO CAPS
25.0000 mg | ORAL_CAPSULE | Freq: Every day | ORAL | 1 refills | Status: AC
  Filled 2023-07-09: qty 30, 30d supply, fill #0

## 2023-07-12 ENCOUNTER — Other Ambulatory Visit: Payer: Self-pay

## 2023-10-03 ENCOUNTER — Emergency Department (HOSPITAL_BASED_OUTPATIENT_CLINIC_OR_DEPARTMENT_OTHER): Payer: MEDICAID | Admitting: Radiology

## 2023-10-03 ENCOUNTER — Emergency Department (HOSPITAL_BASED_OUTPATIENT_CLINIC_OR_DEPARTMENT_OTHER)
Admission: EM | Admit: 2023-10-03 | Discharge: 2023-10-03 | Disposition: A | Discharge status: Home / Self Care | Payer: MEDICAID | Attending: Emergency Medicine | Admitting: Emergency Medicine

## 2023-10-03 DIAGNOSIS — Z7951 Long term (current) use of inhaled steroids: Secondary | ICD-10-CM | POA: Diagnosis not present

## 2023-10-03 DIAGNOSIS — J45909 Unspecified asthma, uncomplicated: Secondary | ICD-10-CM | POA: Insufficient documentation

## 2023-10-03 DIAGNOSIS — M79642 Pain in left hand: Secondary | ICD-10-CM | POA: Insufficient documentation

## 2023-10-03 MED ORDER — IBUPROFEN 600 MG PO TABS
600.0000 mg | ORAL_TABLET | Freq: Four times a day (QID) | ORAL | 0 refills | Status: AC | PRN
  Filled 2023-10-03: qty 30, 8d supply, fill #0

## 2023-10-03 NOTE — Discharge Instructions (Signed)
 As discussed, x-rays negative for any fracture or dislocation.  There is concern for possible scaphoid fracture given where you are tender on exam.  Wear brace until follow-up with orthopedics.  Attaches her number to call to schedule appointment.  Recommend ice, elevation above the level of her heart With swelling as well as taking medication in the form of ibuprofen.  Please do not hesitate to return to the emergency department if the worrisome signs and symptoms we discussed become apparent.

## 2023-10-03 NOTE — ED Triage Notes (Signed)
 Left hand swollen and painful. Denies trauma or injury- unsure what happened to it. Painful hand and wrist. Denies upper arm, elbow pain. Afebrile.

## 2023-10-03 NOTE — ED Provider Notes (Signed)
 No acute osseous abnormality. [place] EMERGENCY DEPARTMENT AT [facility] Provider Note   CSN: [id number] Arrival date & time: 10/03/23  1302     History  No chief complaint on file.   Jeanette ROLIN is a 23 y.o. female.  HPI   23 year old female presents emergency department with complaints of left hand pain.  Patient states that she was intoxicated last night after consuming alcohol and feels like she fell on her right hand.  Denies trauma to head, LOC, chest pain, shortness of breath, abdominal pain.  Currently on menstrual cycle so is not concerned about pregnancy.  Denies pain elsewhere.  Denies any weakness or sensory deficits in left hand.  Past medical history significant for anxiety disorder, MDD, asthma, eczema, history of suicide attempt, angioedema, asthma,  Home Medications Prior to Admission medications   Medication Sig Start Date End Date Taking? Authorizing Provider  albuterol (VENTOLIN HFA) 108 (90 Base) MCG/ACT inhaler INHALE 2 PUFFS INTO THE LUNGS EVERY 4 HOURS AS NEEDED FOR WHEEZING OR SHORTNESS OF BREATH 10/13/22   Gerrit Heck, CNM  ascorbic acid (VITAMIN C) 500 MG tablet Take 1 tablet (500 mg total) by mouth every other day. Take with iron pill 09/18/22   Raelyn Mora, CNM  ferrous sulfate 325 (65 FE) MG tablet Take 1 tablet (325 mg total) by mouth every other day. 07/01/22   Brock Bad, MD  hydrocerin (EUCERIN) CREA Apply 1 Application topically 3 (three) times daily. 04/07/22   Princess Bruins, DO  hydrOXYzine (VISTARIL) 25 MG capsule Take 1 capsule (25 mg total) by mouth at bedtime. 04/30/23     hydrOXYzine (VISTARIL) 25 MG capsule Take 1 capsule (25 mg total) by mouth at bedtime. 07/09/23     ibuprofen (ADVIL) 600 MG tablet Take 1 tablet (600 mg total) by mouth every 6 (six) hours as needed for mild pain, moderate pain or cramping. 11/25/22   Cheral Marker, CNM  loratadine (CLARITIN) 10 MG tablet Take 1 tablet (10 mg total) by mouth daily.  11/02/22   Brock Bad, MD  Prenat-FeCbn-FeAsp-Meth-FA-DHA (PRENATE MINI) 18-0.6-0.4-350 MG CAPS Take 1 capsule by mouth daily before breakfast. 11/02/22   Brock Bad, MD  sertraline (ZOLOFT) 25 MG tablet Take 1 tablet (25 mg total) by mouth daily. 11/02/22 06/19/23  Brock Bad, MD  tacrolimus (PROTOPIC) 0.1 % ointment Apply 1 Application topically 2 (two) times daily. 04/30/23     tacrolimus (PROTOPIC) 0.1 % ointment Apply 1 Application topically to affected skin 2 (two) times daily. 07/09/23     cetirizine (ZYRTEC) 10 MG tablet Take 1 tablet (10 mg total) by mouth daily. Patient not taking: Reported on 03/21/2019 12/31/17 05/30/19  Marcelyn Bruins, MD  fluticasone [facility]) 50 MCG/ACT nasal spray Place 2 sprays into both nostrils daily. Patient not taking: Reported on 03/21/2019 11/11/17 05/30/19  Orvilla Cornwall A, CNM      Allergies    Fish allergy and Shellfish allergy    Review of Systems   Review of Systems  All other systems reviewed and are negative.   Physical Exam Updated Vital Signs BP (!) 146/85 (BP Location: Right Arm)   Pulse 71   Temp 97.7 F (36.5 C)   Resp 18   Ht 5\' 4"  (1.626 m)   SpO2 100%   BMI 31.76 kg/m  Physical Exam Vitals and nursing note reviewed.  Constitutional:      General: She is not in acute distress.    Appearance: She is  well-developed.  HENT:     Head: Normocephalic and atraumatic.  Eyes:     Conjunctiva/sclera: Conjunctivae normal.  Cardiovascular:     Rate and Rhythm: Normal rate and regular rhythm.     Heart sounds: No murmur heard. Pulmonary:     Effort: Pulmonary effort is normal. No respiratory distress.     Breath sounds: Normal breath sounds.  Abdominal:     Palpations: Abdomen is soft.     Tenderness: There is no abdominal tenderness.  Musculoskeletal:        General: No swelling.     Cervical back: Neck supple.     Comments: Full range of motion of digits left hand as well as wrist.  Tender palpation  anatomical snuffbox as well as pain with axial loading of thumb.  Tender palpation along 1 through 3 metacarpals dorsally.  Overlying swelling.  No overlying erythema, palpable fluctuance/induration.  Cap refill less than 2 seconds.  Radial pulses 2+ bilaterally.  Skin:    General: Skin is warm and dry.     Capillary Refill: Capillary refill takes less than 2 seconds.  Neurological:     Mental Status: She is alert.  Psychiatric:        Mood and Affect: Mood normal.     ED Results / Procedures / Treatments   Labs (all labs ordered are listed, but only abnormal results are displayed) Labs Reviewed - No data to display  EKG None  Radiology No results found.  Procedures Procedures    Medications Ordered in ED Medications - No data to display  ED Course/ Medical Decision Making/ A&P                                 Medical Decision Making Amount and/or Complexity of Data Reviewed Radiology: ordered.  Risk Prescription drug management.   This patient presents to the ED for concern of hand pain, this involves an extensive number of treatment options, and is a complaint that carries with it a high risk of complications and morbidity.  The differential diagnosis includes fracture, strain/sprain, dislocation, ligamentous/tendinous injury, neurovascular compromise, infectious etiology, ischemic limb, DVT, other   Co morbidities that complicate the patient evaluation  See HPI   Additional history obtained:  Additional history obtained from EMR External records from outside source obtained and reviewed including hospital records   Lab Tests:  N/a   Imaging Studies ordered:  I ordered imaging studies including left wrist/hand x-ray I independently visualized and interpreted imaging which showed no acute osseous abnormality. I agree with the radiologist interpretation   Cardiac Monitoring: / EKG:  The patient was maintained on a cardiac monitor.  I personally  viewed and interpreted the cardiac monitored which showed an underlying rhythm of: Sinus rhythm   Consultations Obtained:  N/a   Problem List / ED Course / Critical interventions / Medication management  Right hand pain Reevaluation of the patient showed that the patient stayed the same I have reviewed the patients home medicines and have made adjustments as needed   Social Determinants of Health:  Vape use.  Denies illicit drug use.   Test / Admission - Considered:  Right hand pain Vitals signs within normal range and stable throughout visit. Imaging studies significant for: See above 23 year old female presents emergency department with complaints of left hand pain.  Patient states that she was intoxicated last night after consuming alcohol and feels like she fell  on her right hand.  Denies trauma to head, LOC, chest pain, shortness of breath, abdominal pain.  Currently on menstrual cycle so is not concerned about pregnancy.  Denies pain elsewhere.  Denies any weakness or sensory deficits in left hand. On exam, tender to palpation anatomical snuffbox as well as metacarpals 1 through 3.  Full range of motion.  No evidence of neurovascular compromise.  X-ray imaging obtained which was negative for any acute osseous abnormality.  Some concern for underlying scaphoid fracture given the anatomical snuffbox tenderness.  Placed in wrist brace with thumb abduction.  Will recommend rest, ice, nausea, NSAIDs and follow-up with hand specialist in the outpatient setting.  Treatment plan discussed length with patient and she acknowledged understanding was agreeable to said plan.  Patient overall well-appearing, afebrile in no acute distress. Worrisome signs and symptoms were discussed with the patient, and the patient acknowledged understanding to return to the ED if noticed. Patient was stable upon discharge.          Final Clinical Impression(s) / ED Diagnoses Final diagnoses:  None     Rx / DC Orders ED Discharge Orders     None         Peter Garter, [place] 10/03/23 1457    Ernie Avena, MD 10/03/23 [phone]

## 2023-10-03 NOTE — ED Notes (Addendum)
 Discharge paperwork given and verbally understood.... Pt discharged by provider before assessment could be performed.Jeanette KitchenMarland Hall

## 2023-10-04 ENCOUNTER — Other Ambulatory Visit (HOSPITAL_COMMUNITY): Payer: Self-pay

## 2023-10-18 ENCOUNTER — Other Ambulatory Visit (HOSPITAL_COMMUNITY): Payer: Self-pay

## 2024-04-24 ENCOUNTER — Other Ambulatory Visit (HOSPITAL_COMMUNITY): Payer: Self-pay

## 2024-04-24 ENCOUNTER — Other Ambulatory Visit: Payer: Self-pay

## 2024-04-24 ENCOUNTER — Other Ambulatory Visit (HOSPITAL_COMMUNITY)
Admission: RE | Admit: 2024-04-24 | Discharge: 2024-04-24 | Disposition: A | Discharge status: Home / Self Care | Payer: MEDICAID | Source: Ambulatory Visit | Attending: Family Medicine | Admitting: Family Medicine

## 2024-04-24 ENCOUNTER — Ambulatory Visit (INDEPENDENT_AMBULATORY_CARE_PROVIDER_SITE_OTHER): Payer: MEDICAID

## 2024-04-24 VITALS — BP 139/83 | HR 89

## 2024-04-24 DIAGNOSIS — Z113 Encounter for screening for infections with a predominantly sexual mode of transmission: Secondary | ICD-10-CM | POA: Insufficient documentation

## 2024-04-24 MED ORDER — ALBUTEROL SULFATE HFA 108 (90 BASE) MCG/ACT IN AERS
2.0000 | INHALATION_SPRAY | RESPIRATORY_TRACT | 2 refills | Status: AC | PRN
  Filled 2024-04-24: qty 6.7, 17d supply, fill #0
  Filled 2024-08-15: qty 6.7, 17d supply, fill #1

## 2024-04-24 NOTE — Progress Notes (Signed)
 SUBJECTIVE:  23 y.o. female who desires a STI screen. Denies abnormal vaginal discharge, bleeding or significant pelvic pain. No UTI symptoms. Denies history of known exposure to STD.  No LMP recorded.  OBJECTIVE:  She appears well.   ASSESSMENT:  STI Screen   PLAN:  Pt offered STI blood screening-not indicated GC, chlamydia, and trichomonas probe sent to lab.  Treatment: To be determined once lab results are received.  Pt follow up as needed.  Albuterol  inhaler previously rx by us , refill provided per pt request since pt does not currently have a PCP.

## 2024-04-25 ENCOUNTER — Ambulatory Visit: Payer: Self-pay | Admitting: Family Medicine

## 2024-04-25 ENCOUNTER — Other Ambulatory Visit (HOSPITAL_COMMUNITY): Payer: Self-pay

## 2024-04-25 LAB — CERVICOVAGINAL ANCILLARY ONLY
Bacterial Vaginitis (gardnerella): POSITIVE — AB
Candida Glabrata: NEGATIVE
Candida Vaginitis: NEGATIVE
Chlamydia: POSITIVE — AB
Comment: NEGATIVE
Comment: NEGATIVE
Comment: NEGATIVE
Comment: NEGATIVE
Comment: NEGATIVE
Comment: NORMAL
Neisseria Gonorrhea: NEGATIVE
Trichomonas: NEGATIVE

## 2024-04-25 MED ORDER — METRONIDAZOLE 500 MG PO TABS
500.0000 mg | ORAL_TABLET | Freq: Two times a day (BID) | ORAL | 0 refills | Status: DC
  Filled 2024-04-25: qty 14, 7d supply, fill #0

## 2024-04-25 MED ORDER — DOXYCYCLINE HYCLATE 100 MG PO CAPS
100.0000 mg | ORAL_CAPSULE | Freq: Two times a day (BID) | ORAL | 0 refills | Status: AC
  Filled 2024-04-25: qty 14, 7d supply, fill #0

## 2024-04-26 ENCOUNTER — Other Ambulatory Visit (HOSPITAL_COMMUNITY): Payer: Self-pay

## 2024-04-26 ENCOUNTER — Other Ambulatory Visit: Payer: Self-pay

## 2024-04-26 MED ORDER — TACROLIMUS 0.1 % EX OINT
1.0000 | TOPICAL_OINTMENT | Freq: Two times a day (BID) | CUTANEOUS | 1 refills | Status: AC
  Filled 2024-04-26 (×4): qty 60, 30d supply, fill #0
  Filled 2024-05-10 – 2024-06-01 (×4): qty 60, 30d supply, fill #1

## 2024-04-26 MED ORDER — TACROLIMUS 0.1 % EX OINT
1.0000 | TOPICAL_OINTMENT | Freq: Two times a day (BID) | CUTANEOUS | 30 refills | Status: AC
  Filled 2024-04-26: qty 60, 30d supply, fill #0
  Filled 2024-04-26: qty 180, 90d supply, fill #0
  Filled 2024-04-26: qty 60, 30d supply, fill #0

## 2024-04-26 MED ORDER — CEPHALEXIN 250 MG PO CAPS
250.0000 mg | ORAL_CAPSULE | Freq: Three times a day (TID) | ORAL | 0 refills | Status: AC
  Filled 2024-04-26 (×2): qty 30, 10d supply, fill #0

## 2024-04-26 MED ORDER — HYDROXYZINE PAMOATE 25 MG PO CAPS
25.0000 mg | ORAL_CAPSULE | Freq: Every day | ORAL | 1 refills | Status: AC
  Filled 2024-04-26 (×2): qty 30, 30d supply, fill #0

## 2024-05-10 ENCOUNTER — Other Ambulatory Visit (HOSPITAL_COMMUNITY): Payer: Self-pay

## 2024-05-30 ENCOUNTER — Other Ambulatory Visit (HOSPITAL_COMMUNITY): Payer: Self-pay

## 2024-06-01 ENCOUNTER — Other Ambulatory Visit (HOSPITAL_COMMUNITY): Payer: Self-pay

## 2024-06-01 MED ORDER — FLUZONE 0.5 ML IM SUSY
0.5000 mL | PREFILLED_SYRINGE | Freq: Once | INTRAMUSCULAR | 0 refills | Status: AC
  Filled 2024-06-01: qty 0.5, 1d supply, fill #0

## 2024-07-03 ENCOUNTER — Other Ambulatory Visit: Payer: Self-pay

## 2024-07-03 MED ORDER — TACROLIMUS 0.1 % EX OINT
1.0000 | TOPICAL_OINTMENT | Freq: Two times a day (BID) | CUTANEOUS | 1 refills | Status: AC
  Filled 2024-07-03 – 2024-08-10 (×2): qty 60, 30d supply, fill #0

## 2024-07-14 ENCOUNTER — Other Ambulatory Visit: Payer: Self-pay

## 2024-08-10 ENCOUNTER — Other Ambulatory Visit (HOSPITAL_COMMUNITY): Payer: Self-pay

## 2024-08-11 ENCOUNTER — Other Ambulatory Visit (HOSPITAL_COMMUNITY): Payer: Self-pay

## 2024-08-15 ENCOUNTER — Encounter: Payer: Self-pay | Admitting: Pharmacist

## 2024-08-15 ENCOUNTER — Other Ambulatory Visit (HOSPITAL_COMMUNITY): Payer: Self-pay

## 2024-08-15 ENCOUNTER — Other Ambulatory Visit: Payer: Self-pay | Admitting: Family Medicine

## 2024-08-15 ENCOUNTER — Other Ambulatory Visit: Payer: Self-pay

## 2024-08-15 MED ORDER — METRONIDAZOLE 500 MG PO TABS
500.0000 mg | ORAL_TABLET | Freq: Two times a day (BID) | ORAL | 0 refills | Status: AC
  Filled 2024-08-15: qty 14, 7d supply, fill #0

## 2024-08-16 ENCOUNTER — Other Ambulatory Visit: Payer: Self-pay

## 2024-08-16 ENCOUNTER — Other Ambulatory Visit (HOSPITAL_COMMUNITY): Payer: Self-pay

## 2024-08-18 ENCOUNTER — Other Ambulatory Visit: Payer: Self-pay
